# Patient Record
Sex: Female | Born: 1975 | Race: Black or African American | Hispanic: No | Smoking: Never smoker
Health system: Southern US, Community
[De-identification: ages and names within clinical notes are randomized; demographics above are authoritative.]

## PROBLEM LIST (undated history)

## (undated) DIAGNOSIS — E7800 Pure hypercholesterolemia, unspecified: Secondary | ICD-10-CM

## (undated) DIAGNOSIS — M797 Fibromyalgia: Secondary | ICD-10-CM

## (undated) DIAGNOSIS — I10 Essential (primary) hypertension: Secondary | ICD-10-CM

## (undated) DIAGNOSIS — K219 Gastro-esophageal reflux disease without esophagitis: Secondary | ICD-10-CM

## (undated) DIAGNOSIS — R42 Dizziness and giddiness: Secondary | ICD-10-CM

## (undated) DIAGNOSIS — G4730 Sleep apnea, unspecified: Secondary | ICD-10-CM

## (undated) HISTORY — PX: ABDOMINAL SURGERY: SHX537

## (undated) HISTORY — PX: HERNIA REPAIR: SHX51

---

## 2010-11-01 ENCOUNTER — Emergency Department: Payer: Self-pay | Admitting: Emergency Medicine

## 2011-02-09 ENCOUNTER — Emergency Department: Payer: Self-pay | Admitting: *Deleted

## 2011-03-30 ENCOUNTER — Emergency Department: Payer: Self-pay | Admitting: *Deleted

## 2012-02-29 ENCOUNTER — Emergency Department: Payer: Self-pay | Admitting: Emergency Medicine

## 2012-07-28 ENCOUNTER — Emergency Department: Payer: Self-pay | Admitting: Emergency Medicine

## 2012-07-29 LAB — URINALYSIS, COMPLETE
Bacteria: NONE SEEN
Bilirubin,UR: NEGATIVE
Blood: NEGATIVE
Ketone: NEGATIVE
Ph: 5 (ref 4.5–8.0)
Protein: NEGATIVE
RBC,UR: 1 /HPF (ref 0–5)
Specific Gravity: 1.025 (ref 1.003–1.030)
Squamous Epithelial: 1
WBC UR: 1 /HPF (ref 0–5)

## 2017-02-12 DIAGNOSIS — G4733 Obstructive sleep apnea (adult) (pediatric): Secondary | ICD-10-CM | POA: Insufficient documentation

## 2019-05-02 ENCOUNTER — Other Ambulatory Visit: Payer: Self-pay

## 2019-05-02 ENCOUNTER — Emergency Department: Payer: Medicaid - Out of State

## 2019-05-02 ENCOUNTER — Emergency Department
Admission: EM | Admit: 2019-05-02 | Discharge: 2019-05-03 | Disposition: A | Discharge status: Home / Self Care | Payer: Medicaid - Out of State | Attending: Emergency Medicine | Admitting: Emergency Medicine

## 2019-05-02 ENCOUNTER — Encounter: Payer: Self-pay | Admitting: *Deleted

## 2019-05-02 DIAGNOSIS — R05 Cough: Secondary | ICD-10-CM | POA: Diagnosis not present

## 2019-05-02 DIAGNOSIS — R519 Headache, unspecified: Secondary | ICD-10-CM

## 2019-05-02 DIAGNOSIS — I10 Essential (primary) hypertension: Secondary | ICD-10-CM | POA: Insufficient documentation

## 2019-05-02 DIAGNOSIS — Z20828 Contact with and (suspected) exposure to other viral communicable diseases: Secondary | ICD-10-CM | POA: Diagnosis not present

## 2019-05-02 DIAGNOSIS — Z3202 Encounter for pregnancy test, result negative: Secondary | ICD-10-CM | POA: Insufficient documentation

## 2019-05-02 DIAGNOSIS — R0602 Shortness of breath: Secondary | ICD-10-CM

## 2019-05-02 HISTORY — DX: Pure hypercholesterolemia, unspecified: E78.00

## 2019-05-02 HISTORY — DX: Gastro-esophageal reflux disease without esophagitis: K21.9

## 2019-05-02 HISTORY — DX: Dizziness and giddiness: R42

## 2019-05-02 HISTORY — DX: Essential (primary) hypertension: I10

## 2019-05-02 HISTORY — DX: Fibromyalgia: M79.7

## 2019-05-02 LAB — URINALYSIS, COMPLETE (UACMP) WITH MICROSCOPIC
Bacteria, UA: NONE SEEN
Bilirubin Urine: NEGATIVE
Glucose, UA: NEGATIVE mg/dL
Hgb urine dipstick: NEGATIVE
Ketones, ur: NEGATIVE mg/dL
Leukocytes,Ua: NEGATIVE
Nitrite: NEGATIVE
Protein, ur: NEGATIVE mg/dL
Specific Gravity, Urine: 1.016 (ref 1.005–1.030)
pH: 5 (ref 5.0–8.0)

## 2019-05-02 LAB — BASIC METABOLIC PANEL
Anion gap: 10 (ref 5–15)
BUN: 9 mg/dL (ref 6–20)
CO2: 25 mmol/L (ref 22–32)
Calcium: 9.2 mg/dL (ref 8.9–10.3)
Chloride: 102 mmol/L (ref 98–111)
Creatinine, Ser: 0.67 mg/dL (ref 0.44–1.00)
GFR calc Af Amer: >60 mL/min (ref >60)
GFR calc non Af Amer: >60 mL/min (ref >60)
Glucose, Bld: 140 mg/dL — ABNORMAL HIGH (ref 70–99)
Potassium: 3.4 mmol/L — ABNORMAL LOW (ref 3.5–5.1)
Sodium: 137 mmol/L (ref 135–145)

## 2019-05-02 LAB — CBC
HCT: 36.7 % (ref 36.0–46.0)
Hemoglobin: 11.8 g/dL — ABNORMAL LOW (ref 12.0–15.0)
MCH: 27.6 pg (ref 26.0–34.0)
MCHC: 32.2 g/dL (ref 30.0–36.0)
MCV: 85.9 fL (ref 80.0–100.0)
Platelets: 347 10*3/uL (ref 150–400)
RBC: 4.27 MIL/uL (ref 3.87–5.11)
RDW: 14.9 % (ref 11.5–15.5)
WBC: 5.8 10*3/uL (ref 4.0–10.5)
nRBC: 0 % (ref 0.0–0.2)

## 2019-05-02 LAB — POCT PREGNANCY, URINE: Preg Test, Ur: NEGATIVE

## 2019-05-02 LAB — TROPONIN I (HIGH SENSITIVITY): Troponin I (High Sensitivity): 8 ng/L (ref <18)

## 2019-05-02 MED ORDER — SODIUM CHLORIDE 0.9% FLUSH: 3.0000 mL | Freq: Once | INTRAVENOUS | Status: DC

## 2019-05-02 NOTE — ED Triage Notes (Signed)
Pt c/o hypertension and increased shortness of breath. Pt w/ increased dry cough and sinus drainage. Pt's HTN has been poorly controlled x past 3 days. Pt denies exposure to known covid + person. Pt is dizzy in triage and had to lean against a wall to stand until a WC was provided. Pt has moved to this area to take care of her family who is ill and has no PCP in [place]. Pt states she is taking her usual home meds as rx'd and is not out of any of them.

## 2019-05-03 ENCOUNTER — Emergency Department: Payer: Medicaid - Out of State

## 2019-05-03 ENCOUNTER — Encounter: Payer: Self-pay | Admitting: Radiology

## 2019-05-03 LAB — POC SARS CORONAVIRUS 2 AG: SARS Coronavirus 2 Ag: NEGATIVE

## 2019-05-03 LAB — TROPONIN I (HIGH SENSITIVITY): Troponin I (High Sensitivity): 7 ng/L (ref <18)

## 2019-05-03 IMAGING — CT CT ANGIO HEAD
1 of 10 series · 6 of 33 positions shown · IV contrast (omnipaque)
Comparison: None.

CLINICAL DATA: Dizziness

EXAM:
CT ANGIOGRAPHY HEAD AND NECK
TECHNIQUE: Multidetector CT imaging of the head and neck was performed using
the standard protocol during bolus administration of intravenous
contrast. Multiplanar CT image reconstructions and MIPs were
obtained to evaluate the vascular anatomy. Carotid stenosis
measurements (when applicable) are obtained utilizing NASCET
criteria, using the distal internal carotid diameter as the
denominator.
CONTRAST:  75mL OMNIPAQUE IOHEXOL 350 MG/ML SOLN

[Series 11: ax thin · axial · 0.48mm/px · z∈[-233,-31]mm · 6 of 285 slices shown]
[im 41/285  soft-tissue]
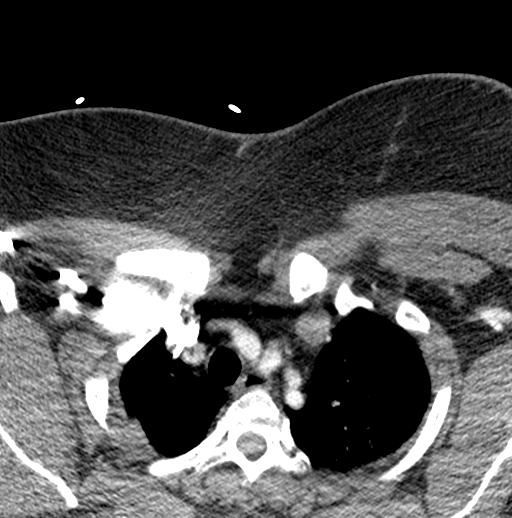
[im 82/285  bone]
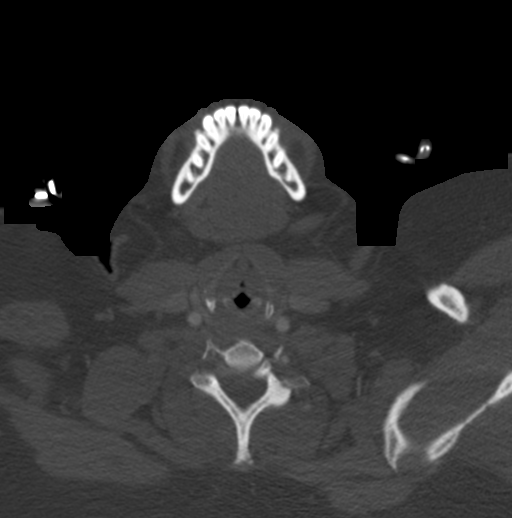
[im 122/285  soft-tissue]
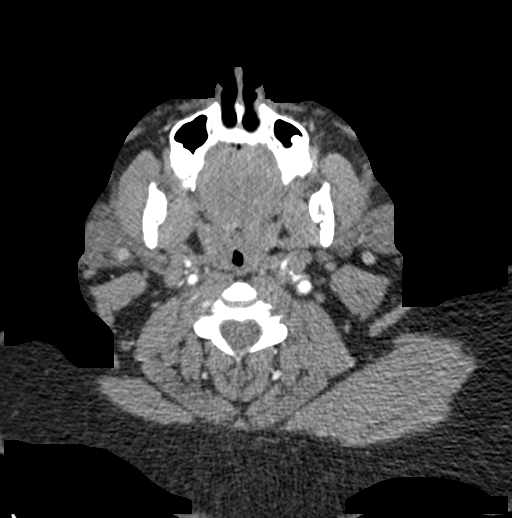
[im 163/285  bone]
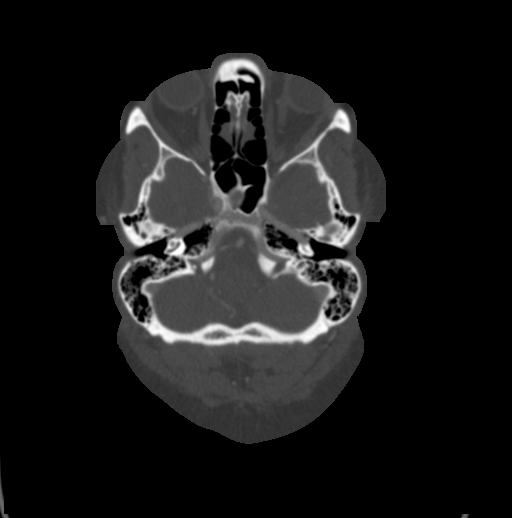
[im 203/285  soft-tissue]
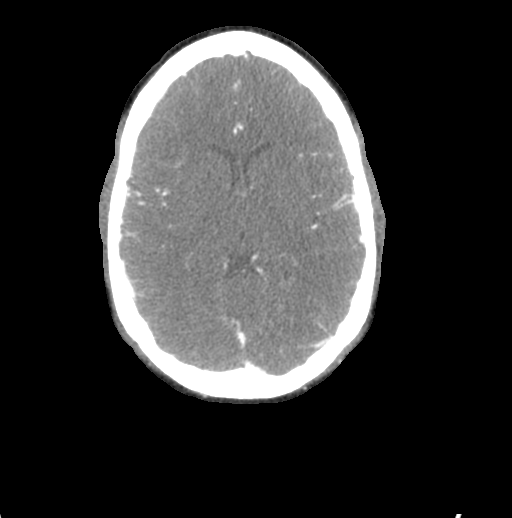
[im 244/285  bone]
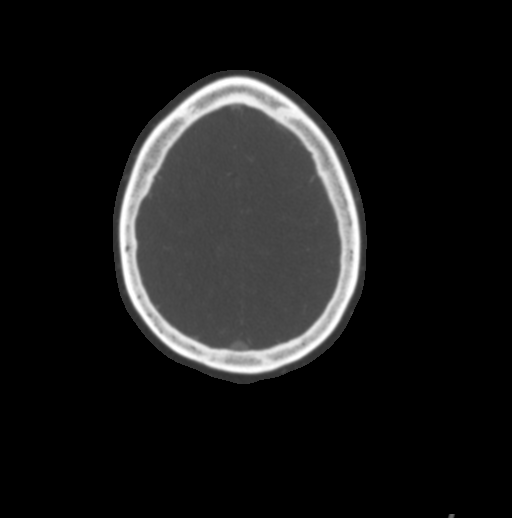

[6 of 33 positions shown; findings below may reference images not displayed]

FINDINGS: CT HEAD FINDINGS

Brain: There is no mass, hemorrhage or extra-axial collection. The
size and configuration of the ventricles and extra-axial CSF spaces
are normal. There is no acute or chronic infarction. The brain
parenchyma is normal.

Skull: The visualized skull base, calvarium and extracranial soft
tissues are normal.

Sinuses/Orbits: No fluid levels or advanced mucosal thickening of
the visualized paranasal sinuses. No mastoid or middle ear effusion.
The orbits are normal.

CTA NECK FINDINGS

SKELETON: There is no bony spinal canal stenosis. No lytic or
blastic lesion.

OTHER NECK: Normal pharynx, larynx and major salivary glands. No
cervical lymphadenopathy. Unremarkable thyroid gland.

UPPER CHEST: No pneumothorax or pleural effusion. No nodules or
masses.

AORTIC ARCH:

There is no calcific atherosclerosis of the aortic arch. There is no
aneurysm, dissection or hemodynamically significant stenosis of the
visualized portion of the aorta. Conventional 3 vessel aortic
branching pattern. The visualized proximal subclavian arteries are
widely patent.

RIGHT CAROTID SYSTEM: Normal without aneurysm, dissection or
stenosis.

LEFT CAROTID SYSTEM: Normal without aneurysm, dissection or
stenosis.

VERTEBRAL ARTERIES: Left dominant configuration. Both origins are
clearly patent. There is no dissection, occlusion or flow-limiting
stenosis to the skull base (V1-V3 segments).

CTA HEAD FINDINGS

POSTERIOR CIRCULATION:

--Vertebral arteries: Normal V4 segments.

--Posterior inferior cerebellar arteries (PICA): Patent origins from
the vertebral arteries.

--Anterior inferior cerebellar arteries (AICA): Patent origins from
the basilar artery.

--Basilar artery: Normal.

--Superior cerebellar arteries: Normal.

--Posterior cerebral arteries: Normal. Both originate from the
basilar artery. Posterior communicating arteries (p-comm) are
diminutive or absent.

ANTERIOR CIRCULATION:

--Intracranial internal carotid arteries: Normal.

--Anterior cerebral arteries (ACA): Normal. Both A1 segments are
present. Patent anterior communicating artery (a-comm).

--Middle cerebral arteries (MCA): Normal.

VENOUS SINUSES: As permitted by contrast timing, patent.

ANATOMIC VARIANTS: None

Review of the MIP images confirms the above findings.
IMPRESSION: Normal CTA of the head and neck.

## 2019-05-03 MED ORDER — IOHEXOL 350 MG/ML SOLN
75.0000 mL | Freq: Once | INTRAVENOUS | Status: AC | PRN
  Administered 2019-05-03: 01:00:00 75 mL via INTRAVENOUS

## 2019-05-03 NOTE — ED Provider Notes (Signed)
[facility] Emergency Department Provider Note  ____________________________________________   First MD Initiated Contact with Patient 05/02/19 2357     (approximate)  I have reviewed the triage vital signs and the nursing notes.   HISTORY  Chief Complaint Shortness of Breath   HPI Shelby Simmons is a 43 y.o. female with below list of previous medical conditions including vertigo presents emergency department secondary to increasing dyspnea headache nonproductive cough sinus drainage over the past 3 days.  Patient states that she has been taking her antihypertensives as planned however does note increased blood pressure as well.  Patient states that she recently had an increase in her dosage.  Patient states that symptoms began after [place] care of her mother.  Patient states that the home that she is in is heated by gas stove and she is concerned that this may be possibly the reason for her symptoms.  Patient denies any fever no facial pain.        Past Medical History:  Diagnosis Date  . Fibromyalgia   . GERD (gastroesophageal reflux disease)   . Hypercholesterolemia   . Hypertension   . Vertigo     There are no problems to display for this patient.   Past Surgical History:  Procedure Laterality Date  . ABDOMINAL SURGERY    . HERNIA REPAIR      Prior to Admission medications   Not on File    Allergies Patient has no known allergies.  History reviewed. No pertinent family history.  Social History Social History   Tobacco Use  . Smoking status: Never Smoker  . Smokeless tobacco: Never Used  Substance Use Topics  . Alcohol use: Yes  . Drug use: Never    Review of Systems Constitutional: No fever/chills Eyes: No visual changes. ENT: No sore throat.  Positive for nasal congestion Cardiovascular: Denies chest pain. Respiratory: Positive for shortness of breath. Gastrointestinal: No abdominal  pain.  No nausea, no vomiting.  No diarrhea.  No constipation. Genitourinary: Negative for dysuria. Musculoskeletal: Negative for neck pain.  Negative for back pain. Integumentary: Negative for rash. Neurological: Negative for headaches, focal weakness or numbness.  Positive for dizziness   ____________________________________________   PHYSICAL EXAM:  VITAL SIGNS: ED Triage Vitals  Enc Vitals Group     BP 05/02/19 2016 (!) 162/104     Pulse Rate 05/02/19 2016 92     Resp 05/02/19 2016 (!) 28     Temp 05/02/19 2016 98.6 F (37 C)     Temp Source 05/02/19 2016 Oral     SpO2 05/02/19 2016 97 %     Weight 05/02/19 2018 99.3 kg (219 lb)     Height 05/02/19 2018 1.473 m (4\' 10" )     Head Circumference --      Peak Flow --      Pain Score 05/02/19 2017 7     Pain Loc --      Pain Edu? --      Excl. in GC? --     Constitutional: Alert and oriented.  Eyes: Conjunctivae are normal.  Head: Atraumatic. Ears:  Healthy appearing ear canals and TMs bilaterally Nose: No congestion/rhinnorhea. Mouth/Throat: Patient is wearing a mask. Neck: No stridor.  No meningeal signs.   Cardiovascular: Normal rate, regular rhythm. Good peripheral circulation. Grossly normal heart sounds. Respiratory: Normal respiratory effort.  No retractions. Gastrointestinal: Soft and nontender. No distention.  Musculoskeletal: No lower extremity tenderness nor  edema. No gross deformities of extremities. Neurologic:  Normal speech and language. No gross focal neurologic deficits are appreciated.  Skin:  Skin is warm, dry and intact. Psychiatric: Mood and affect are normal. Speech and behavior are normal.  ____________________________________________   LABS (all labs ordered are listed, but only abnormal results are displayed)  Labs Reviewed  CBC - Abnormal; Notable for the following components:      Result Value   Hemoglobin 11.8 (*)    All other components within normal limits  URINALYSIS, COMPLETE  (UACMP) WITH MICROSCOPIC - Abnormal; Notable for the following components:   Color, Urine YELLOW (*)    APPearance HAZY (*)    All other components within normal limits  BASIC METABOLIC PANEL - Abnormal; Notable for the following components:   Potassium 3.4 (*)    Glucose, Bld 140 (*)    All other components within normal limits  COOXEMETRY PANEL  POC URINE PREG, ED  POCT PREGNANCY, URINE  POC SARS CORONAVIRUS 2 AG -  ED  POC SARS CORONAVIRUS 2 AG  TROPONIN I (HIGH SENSITIVITY)  TROPONIN I (HIGH SENSITIVITY)   ____________________________________________  EKG  ED ECG REPORT I, Runnels N Chelsy Parrales, the attending physician, personally viewed and interpreted this ECG.   Date: 05/03/2019  EKG Time: 8:32 PM  Rate: 99  Rhythm: Normal sinus rhythm  Axis: Normal  Intervals: Normal  ST&T Change: None  ____________________________________________  RADIOLOGY I,  N Jerime Arif, personally viewed and evaluated these images (plain radiographs) as part of my medical decision making, as well as reviewing the written report by the radiologist.  ED MD interpretation: Normal CTA of the head and neck.  Official radiology report(s): CT Angio Head W or Wo Contrast  Result Date: 05/03/2019 CLINICAL DATA:  Dizziness EXAM: CT ANGIOGRAPHY HEAD AND NECK TECHNIQUE: Multidetector CT imaging of the head and neck was performed using the standard protocol during bolus administration of intravenous contrast. Multiplanar CT image reconstructions and MIPs were obtained to evaluate the vascular anatomy. Carotid stenosis measurements (when applicable) are obtained utilizing NASCET criteria, using the distal internal carotid diameter as the denominator. CONTRAST:  75mL OMNIPAQUE IOHEXOL 350 MG/ML SOLN COMPARISON:  None. FINDINGS: CT HEAD FINDINGS Brain: There is no mass, hemorrhage or extra-axial collection. The size and configuration of the ventricles and extra-axial CSF spaces are normal. There is no acute or  chronic infarction. The brain parenchyma is normal. Skull: The visualized skull base, calvarium and extracranial soft tissues are normal. Sinuses/Orbits: No fluid levels or advanced mucosal thickening of the visualized paranasal sinuses. No mastoid or middle ear effusion. The orbits are normal. CTA NECK FINDINGS SKELETON: There is no bony spinal canal stenosis. No lytic or blastic lesion. OTHER NECK: Normal pharynx, larynx and major salivary glands. No cervical lymphadenopathy. Unremarkable thyroid gland. UPPER CHEST: No pneumothorax or pleural effusion. No nodules or masses. AORTIC ARCH: There is no calcific atherosclerosis of the aortic arch. There is no aneurysm, dissection or hemodynamically significant stenosis of the visualized portion of the aorta. Conventional 3 vessel aortic branching pattern. The visualized proximal subclavian arteries are widely patent. RIGHT CAROTID SYSTEM: Normal without aneurysm, dissection or stenosis. LEFT CAROTID SYSTEM: Normal without aneurysm, dissection or stenosis. VERTEBRAL ARTERIES: Left dominant configuration. Both origins are clearly patent. There is no dissection, occlusion or flow-limiting stenosis to the skull base (V1-V3 segments). CTA HEAD FINDINGS POSTERIOR CIRCULATION: --Vertebral arteries: Normal V4 segments. --Posterior inferior cerebellar arteries (PICA): Patent origins from the vertebral arteries. --Anterior inferior cerebellar arteries (AICA): Patent  origins from the basilar artery. --Basilar artery: Normal. --Superior cerebellar arteries: Normal. --Posterior cerebral arteries: Normal. Both originate from the basilar artery. Posterior communicating arteries (p-comm) are diminutive or absent. ANTERIOR CIRCULATION: --Intracranial internal carotid arteries: Normal. --Anterior cerebral arteries (ACA): Normal. Both A1 segments are present. Patent anterior communicating artery (a-comm). --Middle cerebral arteries (MCA): Normal. VENOUS SINUSES: As permitted by contrast  timing, patent. ANATOMIC VARIANTS: None Review of the MIP images confirms the above findings. IMPRESSION: Normal CTA of the head and neck. Electronically Signed   By: Ulyses Jarred M.D.   On: 05/03/2019 01:03   CT Angio Neck W and/or Wo Contrast  Result Date: 05/03/2019 CLINICAL DATA:  Dizziness EXAM: CT ANGIOGRAPHY HEAD AND NECK TECHNIQUE: Multidetector CT imaging of the head and neck was performed using the standard protocol during bolus administration of intravenous contrast. Multiplanar CT image reconstructions and MIPs were obtained to evaluate the vascular anatomy. Carotid stenosis measurements (when applicable) are obtained utilizing NASCET criteria, using the distal internal carotid diameter as the denominator. CONTRAST:  44mL OMNIPAQUE IOHEXOL 350 MG/ML SOLN COMPARISON:  None. FINDINGS: CT HEAD FINDINGS Brain: There is no mass, hemorrhage or extra-axial collection. The size and configuration of the ventricles and extra-axial CSF spaces are normal. There is no acute or chronic infarction. The brain parenchyma is normal. Skull: The visualized skull base, calvarium and extracranial soft tissues are normal. Sinuses/Orbits: No fluid levels or advanced mucosal thickening of the visualized paranasal sinuses. No mastoid or middle ear effusion. The orbits are normal. CTA NECK FINDINGS SKELETON: There is no bony spinal canal stenosis. No lytic or blastic lesion. OTHER NECK: Normal pharynx, larynx and major salivary glands. No cervical lymphadenopathy. Unremarkable thyroid gland. UPPER CHEST: No pneumothorax or pleural effusion. No nodules or masses. AORTIC ARCH: There is no calcific atherosclerosis of the aortic arch. There is no aneurysm, dissection or hemodynamically significant stenosis of the visualized portion of the aorta. Conventional 3 vessel aortic branching pattern. The visualized proximal subclavian arteries are widely patent. RIGHT CAROTID SYSTEM: Normal without aneurysm, dissection or stenosis. LEFT  CAROTID SYSTEM: Normal without aneurysm, dissection or stenosis. VERTEBRAL ARTERIES: Left dominant configuration. Both origins are clearly patent. There is no dissection, occlusion or flow-limiting stenosis to the skull base (V1-V3 segments). CTA HEAD FINDINGS POSTERIOR CIRCULATION: --Vertebral arteries: Normal V4 segments. --Posterior inferior cerebellar arteries (PICA): Patent origins from the vertebral arteries. --Anterior inferior cerebellar arteries (AICA): Patent origins from the basilar artery. --Basilar artery: Normal. --Superior cerebellar arteries: Normal. --Posterior cerebral arteries: Normal. Both originate from the basilar artery. Posterior communicating arteries (p-comm) are diminutive or absent. ANTERIOR CIRCULATION: --Intracranial internal carotid arteries: Normal. --Anterior cerebral arteries (ACA): Normal. Both A1 segments are present. Patent anterior communicating artery (a-comm). --Middle cerebral arteries (MCA): Normal. VENOUS SINUSES: As permitted by contrast timing, patent. ANATOMIC VARIANTS: None Review of the MIP images confirms the above findings. IMPRESSION: Normal CTA of the head and neck. Electronically Signed   By: Ulyses Jarred M.D.   On: 05/03/2019 01:03   DG Chest Port 1 View  Result Date: 05/02/2019 CLINICAL DATA:  Increasing shortness of breath with hypertension. EXAM: PORTABLE CHEST 1 VIEW COMPARISON:  November 01, 2010 FINDINGS: The heart size is borderline enlarged but similar to prior study. There is no pneumothorax. No large pleural effusion. No focal infiltrate. No acute osseous abnormality. IMPRESSION: No active disease. Electronically Signed   By: Constance Holster M.D.   On: 05/02/2019 23:11     Procedures   ____________________________________________   INITIAL IMPRESSION /  MDM / ASSESSMENT AND PLAN / ED COURSE  As part of my medical decision making, I reviewed the following data within the electronic MEDICAL RECORD NUMBER   43 year old female presented with  above-stated history and physical exam secondary to above-stated symptoms.  Concern for possible intracranial pathology including posterior circulation pathology given posterior neck pain headache and hypertension CT angiogram of the head and neck performed which revealed no acute pathology.  Also considered possibly of sinusitis however sinuses were normal on CT.  Considered possibility of carboxyhemoglobin and as such this was performed which was also normal. ____________________________________________  FINAL CLINICAL IMPRESSION(S) / ED DIAGNOSES  Final diagnoses:  Nonintractable headache, unspecified chronicity pattern, unspecified headache type  Hypertension, unspecified type     MEDICATIONS GIVEN DURING THIS VISIT:  Medications  iohexol (OMNIPAQUE) 350 MG/ML injection 75 mL (75 mLs Intravenous Contrast Given 05/03/19 0047)     ED Discharge Orders    None      *Please note:  Shelby Simmons was evaluated in Emergency Department on 05/03/2019 for the symptoms described in the history of present illness. She was evaluated in the context of the global COVID-19 pandemic, which necessitated consideration that the patient might be at risk for infection with the SARS-CoV-2 virus that causes COVID-19. Institutional protocols and algorithms that pertain to the evaluation of patients at risk for COVID-19 are in a state of rapid change based on information released by regulatory bodies including the CDC and federal and state organizations. These policies and algorithms were followed during the patient's care in the ED.  Some ED evaluations and interventions may be delayed as a result of limited staffing during the pandemic.*  Note:  This document was prepared using Dragon voice recognition software and may include unintentional dictation errors.   Darci Current, MD 05/03/19 [phone]

## 2019-05-14 LAB — COOXEMETRY PANEL
Carboxyhemoglobin: 0 % — ABNORMAL LOW (ref 0.5–1.5)
Methemoglobin: 0 % (ref 0.0–1.5)
O2 Saturation: 87.7 %

## 2020-02-09 DIAGNOSIS — M797 Fibromyalgia: Secondary | ICD-10-CM | POA: Insufficient documentation

## 2020-02-09 DIAGNOSIS — G8929 Other chronic pain: Secondary | ICD-10-CM | POA: Insufficient documentation

## 2020-02-09 DIAGNOSIS — I10 Essential (primary) hypertension: Secondary | ICD-10-CM | POA: Insufficient documentation

## 2020-02-21 DIAGNOSIS — D649 Anemia, unspecified: Secondary | ICD-10-CM | POA: Insufficient documentation

## 2020-03-24 DIAGNOSIS — Z8719 Personal history of other diseases of the digestive system: Secondary | ICD-10-CM | POA: Insufficient documentation

## 2020-04-05 DIAGNOSIS — G5601 Carpal tunnel syndrome, right upper limb: Secondary | ICD-10-CM | POA: Insufficient documentation

## 2020-09-29 ENCOUNTER — Other Ambulatory Visit: Payer: Self-pay

## 2020-09-29 ENCOUNTER — Encounter: Payer: Self-pay | Admitting: Emergency Medicine

## 2020-09-29 ENCOUNTER — Emergency Department
Admission: EM | Admit: 2020-09-29 | Discharge: 2020-09-29 | Disposition: A | Discharge status: Home / Self Care | Payer: Self-pay | Attending: Emergency Medicine | Admitting: Emergency Medicine

## 2020-09-29 ENCOUNTER — Emergency Department: Payer: Self-pay

## 2020-09-29 DIAGNOSIS — Z79899 Other long term (current) drug therapy: Secondary | ICD-10-CM | POA: Insufficient documentation

## 2020-09-29 DIAGNOSIS — S20211A Contusion of right front wall of thorax, initial encounter: Secondary | ICD-10-CM | POA: Insufficient documentation

## 2020-09-29 DIAGNOSIS — I10 Essential (primary) hypertension: Secondary | ICD-10-CM | POA: Insufficient documentation

## 2020-09-29 DIAGNOSIS — Y92410 Unspecified street and highway as the place of occurrence of the external cause: Secondary | ICD-10-CM | POA: Insufficient documentation

## 2020-09-29 DIAGNOSIS — S9031XA Contusion of right foot, initial encounter: Secondary | ICD-10-CM | POA: Insufficient documentation

## 2020-09-29 MED ORDER — LIDOCAINE 5 % EX PTCH
2.0000 | MEDICATED_PATCH | CUTANEOUS | Status: DC
  Administered 2020-09-29: 2 via TRANSDERMAL
  Filled 2020-09-29: qty 2

## 2020-09-29 MED ORDER — NAPROXEN 500 MG PO TABS
500.0000 mg | ORAL_TABLET | Freq: Once | ORAL | Status: AC
  Administered 2020-09-29: 500 mg via ORAL
  Filled 2020-09-29: qty 1

## 2020-09-29 MED ORDER — ORPHENADRINE CITRATE ER 100 MG PO TB12: 100.0000 mg | ORAL_TABLET | Freq: Two times a day (BID) | ORAL | 0 refills | Status: DC

## 2020-09-29 MED ORDER — CYCLOBENZAPRINE HCL 10 MG PO TABS
10.0000 mg | ORAL_TABLET | Freq: Once | ORAL | Status: AC
  Administered 2020-09-29: 10 mg via ORAL
  Filled 2020-09-29: qty 1

## 2020-09-29 MED ORDER — NAPROXEN 500 MG PO TABS: 500.0000 mg | ORAL_TABLET | Freq: Two times a day (BID) | ORAL | 0 refills | Status: DC

## 2020-09-29 MED ORDER — TRAMADOL HCL 50 MG PO TABS
50.0000 mg | ORAL_TABLET | Freq: Once | ORAL | Status: AC
  Administered 2020-09-29: 50 mg via ORAL
  Filled 2020-09-29: qty 1
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
## 2020-09-29 NOTE — Discharge Instructions (Signed)
No acute findings on chest and foot x-ray.  Follow discharge care instruction take medication as directed.  Wear the Lidoderm patch for 12 hours.

## 2020-12-08 DIAGNOSIS — Z139 Encounter for screening, unspecified: Secondary | ICD-10-CM

## 2020-12-08 LAB — GLUCOSE, POCT (MANUAL RESULT ENTRY): POC Glucose: 89 mg/dl (ref 70–99)
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
## 2021-01-20 ENCOUNTER — Other Ambulatory Visit (HOSPITAL_COMMUNITY): Payer: Self-pay | Admitting: Family Medicine

## 2021-01-20 ENCOUNTER — Other Ambulatory Visit: Payer: Self-pay | Admitting: Family Medicine

## 2021-01-20 DIAGNOSIS — R1011 Right upper quadrant pain: Secondary | ICD-10-CM

## 2021-01-20 DIAGNOSIS — R110 Nausea: Secondary | ICD-10-CM

## 2021-01-25 ENCOUNTER — Other Ambulatory Visit: Payer: Self-pay | Admitting: Family Medicine

## 2021-01-25 DIAGNOSIS — R112 Nausea with vomiting, unspecified: Secondary | ICD-10-CM

## 2021-01-25 DIAGNOSIS — R109 Unspecified abdominal pain: Secondary | ICD-10-CM

## 2021-01-25 DIAGNOSIS — R1011 Right upper quadrant pain: Secondary | ICD-10-CM

## 2021-02-11 ENCOUNTER — Other Ambulatory Visit: Payer: Self-pay

## 2021-02-11 ENCOUNTER — Ambulatory Visit
Admission: RE | Admit: 2021-02-11 | Discharge: 2021-02-11 | Disposition: A | Discharge status: Home / Self Care | Payer: Self-pay | Source: Ambulatory Visit | Attending: Family Medicine | Admitting: Family Medicine

## 2021-02-11 DIAGNOSIS — R109 Unspecified abdominal pain: Secondary | ICD-10-CM | POA: Insufficient documentation

## 2021-02-11 DIAGNOSIS — R112 Nausea with vomiting, unspecified: Secondary | ICD-10-CM | POA: Insufficient documentation

## 2021-02-11 DIAGNOSIS — R1011 Right upper quadrant pain: Secondary | ICD-10-CM | POA: Insufficient documentation

## 2021-02-11 LAB — POCT I-STAT CREATININE: Creatinine, Ser: 0.7 mg/dL (ref 0.44–1.00)

## 2021-02-11 MED ORDER — IOHEXOL 350 MG/ML SOLN
100.0000 mL | Freq: Once | INTRAVENOUS | Status: AC | PRN
  Administered 2021-02-11: 100 mL via INTRAVENOUS

## 2021-02-14 ENCOUNTER — Ambulatory Visit: Admission: RE | Admit: 2021-02-14 | Payer: Self-pay | Source: Ambulatory Visit

## 2021-11-14 DIAGNOSIS — R531 Weakness: Secondary | ICD-10-CM | POA: Insufficient documentation

## 2021-11-14 DIAGNOSIS — K219 Gastro-esophageal reflux disease without esophagitis: Secondary | ICD-10-CM | POA: Insufficient documentation

## 2021-11-15 DIAGNOSIS — F444 Conversion disorder with motor symptom or deficit: Secondary | ICD-10-CM | POA: Insufficient documentation

## 2022-04-05 ENCOUNTER — Encounter: Payer: Self-pay | Admitting: Emergency Medicine

## 2022-04-05 ENCOUNTER — Ambulatory Visit
Admission: EM | Admit: 2022-04-05 | Discharge: 2022-04-05 | Disposition: A | Discharge status: Home / Self Care | Payer: Self-pay | Attending: Family Medicine | Admitting: Family Medicine

## 2022-04-05 DIAGNOSIS — Z1152 Encounter for screening for COVID-19: Secondary | ICD-10-CM | POA: Insufficient documentation

## 2022-04-05 DIAGNOSIS — R058 Other specified cough: Secondary | ICD-10-CM | POA: Insufficient documentation

## 2022-04-05 DIAGNOSIS — M546 Pain in thoracic spine: Secondary | ICD-10-CM | POA: Insufficient documentation

## 2022-04-05 DIAGNOSIS — E871 Hypo-osmolality and hyponatremia: Secondary | ICD-10-CM | POA: Insufficient documentation

## 2022-04-05 DIAGNOSIS — J40 Bronchitis, not specified as acute or chronic: Secondary | ICD-10-CM | POA: Insufficient documentation

## 2022-04-05 DIAGNOSIS — Z79899 Other long term (current) drug therapy: Secondary | ICD-10-CM | POA: Insufficient documentation

## 2022-04-05 DIAGNOSIS — R197 Diarrhea, unspecified: Secondary | ICD-10-CM | POA: Insufficient documentation

## 2022-04-05 DIAGNOSIS — R1010 Upper abdominal pain, unspecified: Secondary | ICD-10-CM | POA: Insufficient documentation

## 2022-04-05 DIAGNOSIS — J329 Chronic sinusitis, unspecified: Secondary | ICD-10-CM | POA: Insufficient documentation

## 2022-04-05 DIAGNOSIS — E876 Hypokalemia: Secondary | ICD-10-CM | POA: Insufficient documentation

## 2022-04-05 DIAGNOSIS — H6692 Otitis media, unspecified, left ear: Secondary | ICD-10-CM | POA: Insufficient documentation

## 2022-04-05 LAB — URINALYSIS, ROUTINE W REFLEX MICROSCOPIC
Bilirubin Urine: NEGATIVE
Glucose, UA: NEGATIVE mg/dL
Hgb urine dipstick: NEGATIVE
Ketones, ur: NEGATIVE mg/dL
Leukocytes,Ua: NEGATIVE
Nitrite: NEGATIVE
Protein, ur: NEGATIVE mg/dL
Specific Gravity, Urine: <1.005 — ABNORMAL LOW (ref 1.005–1.030)
pH: 6 (ref 5.0–8.0)

## 2022-04-05 LAB — CBC WITH DIFFERENTIAL/PLATELET
Abs Immature Granulocytes: 0.02 10*3/uL (ref 0.00–0.07)
Basophils Absolute: 0 10*3/uL (ref 0.0–0.1)
Basophils Relative: 0 %
Eosinophils Absolute: 0.1 10*3/uL (ref 0.0–0.5)
Eosinophils Relative: 1 %
HCT: 37.8 % (ref 36.0–46.0)
Hemoglobin: 12.8 g/dL (ref 12.0–15.0)
Immature Granulocytes: 0 %
Lymphocytes Relative: 38 %
Lymphs Abs: 2.7 10*3/uL (ref 0.7–4.0)
MCH: 31.4 pg (ref 26.0–34.0)
MCHC: 33.9 g/dL (ref 30.0–36.0)
MCV: 92.9 fL (ref 80.0–100.0)
Monocytes Absolute: 0.7 10*3/uL (ref 0.1–1.0)
Monocytes Relative: 10 %
Neutro Abs: 3.7 10*3/uL (ref 1.7–7.7)
Neutrophils Relative %: 51 %
Platelets: 272 10*3/uL (ref 150–400)
RBC: 4.07 MIL/uL (ref 3.87–5.11)
RDW: 14.3 % (ref 11.5–15.5)
WBC: 7.2 10*3/uL (ref 4.0–10.5)
nRBC: 0 % (ref 0.0–0.2)

## 2022-04-05 LAB — COMPREHENSIVE METABOLIC PANEL
ALT: 23 U/L (ref 0–44)
AST: 25 U/L (ref 15–41)
Albumin: 3.8 g/dL (ref 3.5–5.0)
Alkaline Phosphatase: 55 U/L (ref 38–126)
Anion gap: 6 (ref 5–15)
BUN: 12 mg/dL (ref 6–20)
CO2: 23 mmol/L (ref 22–32)
Calcium: 9.4 mg/dL (ref 8.9–10.3)
Chloride: 105 mmol/L (ref 98–111)
Creatinine, Ser: 0.95 mg/dL (ref 0.44–1.00)
GFR, Estimated: >60 mL/min (ref >60)
Glucose, Bld: 94 mg/dL (ref 70–99)
Potassium: 3.4 mmol/L — ABNORMAL LOW (ref 3.5–5.1)
Sodium: 134 mmol/L — ABNORMAL LOW (ref 135–145)
Total Bilirubin: 0.4 mg/dL (ref 0.3–1.2)
Total Protein: 8 g/dL (ref 6.5–8.1)

## 2022-04-05 LAB — RESP PANEL BY RT-PCR (FLU A&B, COVID) ARPGX2
Influenza A by PCR: NEGATIVE
Influenza B by PCR: NEGATIVE
SARS Coronavirus 2 by RT PCR: NEGATIVE

## 2022-04-05 LAB — LIPASE, BLOOD: Lipase: 42 U/L (ref 11–51)

## 2022-04-05 MED ORDER — AZITHROMYCIN 250 MG PO TABS: 250.0000 mg | ORAL_TABLET | Freq: Every day | ORAL | 0 refills | Status: DC

## 2022-04-05 MED ORDER — PREDNISONE 20 MG PO TABS: 40.0000 mg | ORAL_TABLET | Freq: Every day | ORAL | 0 refills | Status: AC

## 2022-04-05 MED ORDER — PROMETHAZINE HCL 25 MG/ML IJ SOLN
25.0000 mg | Freq: Once | INTRAMUSCULAR | Status: AC
  Administered 2022-04-05: 25 mg via INTRAMUSCULAR

## 2022-04-05 MED ORDER — ONDANSETRON HCL 8 MG PO TABS: 8.0000 mg | ORAL_TABLET | Freq: Three times a day (TID) | ORAL | 0 refills | Status: DC | PRN

## 2022-04-05 NOTE — Discharge Instructions (Addendum)
Your COVID and influenza tests are negative.  You have an ear infection and likely a lung infection. Stop by the pharmacy to pick up your prescriptions.  Follow up with your primary care provider or gut doctor. Stop taking Miralax as it is causing diarrhea.  Use miralax if you have

## 2022-04-05 NOTE — ED Triage Notes (Signed)
Pt c/o left ear pain, mid back pain, cough and vomiting x 2 weeks. Patient states she took an at home covid test 4 days ago and it was positive.

## 2022-04-05 NOTE — ED Provider Notes (Signed)
[facility]    CSN: [id number] Arrival date & time: 04/05/22  1533      History   Chief Complaint Chief Complaint  Patient presents with   Otalgia   Emesis   Back Pain    HPI Shelby Simmons is a 46 y.o. female.   HPI  Shelby Simmons presents for vomiting for the past 2 weeks.  Feels like she is swollen around her upper abdomen. She has upper abdominal pain, mid back pain that started over the weekend.  Left ear pain started 2 days ago.  When she takes Miralax  she is able to eat.  If she doesn't take the Miralax she doesn't go to the bathroom.  When she coughs she has a bowel movement as she has diarrhea. Cough since around Halloween.  States she had a COVID test was positive 4 days ago.      Past Medical History:  Diagnosis Date   Fibromyalgia    GERD (gastroesophageal reflux disease)    Hypercholesterolemia    Hypertension    Vertigo     There are no problems to display for this patient.   Past Surgical History:  Procedure Laterality Date   ABDOMINAL SURGERY     HERNIA REPAIR      OB History   No obstetric history on file.      Home Medications    Prior to Admission medications   Medication Sig Start Date End Date Taking? Authorizing Provider  azithromycin (ZITHROMAX Z-PAK) 250 MG tablet Take 1 tablet (250 mg total) by mouth daily. Take 2 tablets one the first day. 04/05/22  Yes Nahlia Hellmann, DO  DULoxetine (CYMBALTA) 60 MG capsule Take 60 mg by mouth daily.   Yes [provider]  lisinopril-hydrochlorothiazide (ZESTORETIC) 20-12.5 MG tablet Take 1 tablet by mouth daily.   Yes [provider]  ondansetron (ZOFRAN) 8 MG tablet Take 1 tablet (8 mg total) by mouth every 8 (eight) hours as needed for nausea or vomiting. 04/05/22  Yes Kemond Amorin, DO  predniSONE (DELTASONE) 20 MG tablet Take 2 tablets (40 mg total) by mouth daily for 5 days. 04/05/22 04/10/22 Yes Pasqualina Colasurdo, DO  traZODone (DESYREL) 50 MG tablet Take 50 mg by  mouth at bedtime.   Yes [provider]  albuterol (VENTOLIN HFA) 108 (90 Base) MCG/ACT inhaler Inhale 2 puffs into the lungs every 6 (six) hours as needed for wheezing or shortness of breath.    [provider]  naproxen (NAPROSYN) 500 MG tablet Take 1 tablet (500 mg total) by mouth 2 (two) times daily with a meal. 09/29/20   Joni Reining, PA-C  orphenadrine (NORFLEX) 100 MG tablet Take 1 tablet (100 mg total) by mouth 2 (two) times daily. 09/29/20   Joni Reining, PA-C    Family History History reviewed. No pertinent family history.  Social History Social History   Tobacco Use   Smoking status: Never   Smokeless tobacco: Never  Vaping Use   Vaping Use: Never used  Substance Use Topics   Alcohol use: Yes   Drug use: Never     Allergies   Amoxicillin, Shellfish allergy, and Latex   Review of Systems Review of Systems : negative unless otherwise stated in HPI.      Physical Exam Triage Vital Signs ED Triage Vitals  Enc Vitals Group     BP 04/05/22 1600 120/84     Pulse Rate 04/05/22 1600 84     Resp 04/05/22 1600 18  Temp 04/05/22 1600 98.8 F (37.1 C)     Temp Source 04/05/22 1600 Oral     SpO2 04/05/22 1600 97 %     Weight --      Height --      Head Circumference --      Peak Flow --      Pain Score 04/05/22 1558 8     Pain Loc --      Pain Edu? --      Excl. in GC? --    No data found.  Updated Vital Signs BP 120/84 (BP Location: Right Arm)   Pulse 84   Temp 98.8 F (37.1 C) (Oral)   Resp 18   LMP  (LMP Unknown)   SpO2 97%   Visual Acuity Right Eye Distance:   Left Eye Distance:   Bilateral Distance:    Right Eye Near:   Left Eye Near:    Bilateral Near:     Physical Exam  GEN: alert, well appearing female, in no acute distress     HENT:  mucus membranes moist, oropharyngeal without lesions or erythema,  nares patent, no nasal discharge, left TM opaque and erythematous, right TM normal EYES:   pupils equal and  reactive, EOM intact NECK:  supple, normal ROM, no lymphadenopathy  RESP:  clear to auscultation bilaterally, coarse breathe sounds, no wheezing CVS:   regular rate and rhythm, no murmur, distal pulses intact   ABD:  soft, generalized tenderness; bowel sounds present; no palpable masses,  EXT:   normal ROM, atraumatic, no edema  NEURO:  normal without focal findings,  speech normal, alert and oriented, strength 5/5 bilateral lower extremities   Skin:   warm and dry, no rash, normal skin turgor Psych: Normal affect, appropriate speech and behavior    UC Treatments / Results  Labs (all labs ordered are listed, but only abnormal results are displayed) Labs Reviewed  URINALYSIS, ROUTINE W REFLEX MICROSCOPIC - Abnormal; Notable for the following components:      Result Value   Specific Gravity, Urine <1.005 (*)    All other components within normal limits  COMPREHENSIVE METABOLIC PANEL - Abnormal; Notable for the following components:   Sodium 134 (*)    Potassium 3.4 (*)    All other components within normal limits  RESP PANEL BY RT-PCR (FLU A&B, COVID) ARPGX2  CBC WITH DIFFERENTIAL/PLATELET  LIPASE, BLOOD    EKG   Radiology No results found.  Procedures Procedures (including critical care time)  Medications Ordered in UC Medications  promethazine (PHENERGAN) injection 25 mg (25 mg Intramuscular Given 04/05/22 1737)    Initial Impression / Assessment and Plan / UC Course  I have reviewed the triage vital signs and the nursing notes.  Pertinent labs & imaging results that were available during my care of the patient were reviewed by me and considered in my medical decision making (see chart for details).     Patient is a 46 year old female with intermittent abdominal pain, vomiting and diarrhea.  She reports that she had a positive home COVID test 4 days ago but in conversation with her she says the test only showed 1 line at the C.  I do not believe she properly read the  COVID test so this was repeated with her permission this evening.  COVID and influenza was both negative.  Urinalysis was unremarkable for hematuria and acute cystitis.  CBC did not show any leukocytosis or anemia.  Lipase was normal therefore doubt pancreatitis.  She had a mild hyponatremia (Na 134) and hypokalemia (K 3.4).  This may be secondary to her diarrhea and vomiting.  No bowel meds and kidney and liver function test.  Her blood sugar was normal at 94.   I suspect that the diarrhea may be secondary to her use of MiraLAX.  Advised her to not use this medication for the next couple of days and to resume if she starts to have straining with bowel movements or pellet-like stools.  She was given Phenergan IM for nausea and vomiting.  This provided her some relief.  She has evidence of acute otitis media on the left.  Given her ongoing cough and nasal congestion we will also treat for sinobronchitis with steroids and antibiotics as below.  Return and ED precautions given and patient and her husband voiced understanding. Discussed MDM, treatment plan and plan for follow-up with patient/parent who agrees with plan.    Final Clinical Impressions(s) / UC Diagnoses   Final diagnoses:  Left acute otitis media  Sinobronchitis     Discharge Instructions      Your COVID and influenza tests are negative.  You have an ear infection and likely a lung infection. Stop by the pharmacy to pick up your prescriptions.  Follow up with your primary care provider or gut doctor. Stop taking Miralax as it is causing diarrhea.  Use miralax if you have      ED Prescriptions     Medication Sig Dispense Auth. Provider   ondansetron (ZOFRAN) 8 MG tablet Take 1 tablet (8 mg total) by mouth every 8 (eight) hours as needed for nausea or vomiting. 20 tablet Prentiss Hammett, DO   azithromycin (ZITHROMAX Z-PAK) 250 MG tablet Take 1 tablet (250 mg total) by mouth daily. Take 2 tablets one the first day. 6 tablet  Temara Lanum, DO   predniSONE (DELTASONE) 20 MG tablet Take 2 tablets (40 mg total) by mouth daily for 5 days. 10 tablet Katha Cabal, DO      PDMP not reviewed this encounter.   Katha Cabal, DO 04/07/22 1830

## 2022-04-24 DIAGNOSIS — J45909 Unspecified asthma, uncomplicated: Secondary | ICD-10-CM | POA: Insufficient documentation

## 2022-04-24 DIAGNOSIS — R202 Paresthesia of skin: Secondary | ICD-10-CM | POA: Insufficient documentation

## 2022-04-24 DIAGNOSIS — F32A Depression, unspecified: Secondary | ICD-10-CM | POA: Insufficient documentation

## 2022-04-25 DIAGNOSIS — J189 Pneumonia, unspecified organism: Secondary | ICD-10-CM | POA: Insufficient documentation

## 2022-05-23 ENCOUNTER — Ambulatory Visit
Admission: EM | Admit: 2022-05-23 | Discharge: 2022-05-23 | Disposition: A | Discharge status: Home / Self Care | Payer: 59 | Attending: Physician Assistant | Admitting: Physician Assistant

## 2022-05-23 ENCOUNTER — Ambulatory Visit (INDEPENDENT_AMBULATORY_CARE_PROVIDER_SITE_OTHER): Payer: 59

## 2022-05-23 DIAGNOSIS — R0602 Shortness of breath: Secondary | ICD-10-CM | POA: Diagnosis not present

## 2022-05-23 DIAGNOSIS — J101 Influenza due to other identified influenza virus with other respiratory manifestations: Secondary | ICD-10-CM | POA: Diagnosis not present

## 2022-05-23 DIAGNOSIS — Z1152 Encounter for screening for COVID-19: Secondary | ICD-10-CM | POA: Diagnosis not present

## 2022-05-23 DIAGNOSIS — R0789 Other chest pain: Secondary | ICD-10-CM | POA: Diagnosis not present

## 2022-05-23 DIAGNOSIS — J111 Influenza due to unidentified influenza virus with other respiratory manifestations: Secondary | ICD-10-CM

## 2022-05-23 DIAGNOSIS — R051 Acute cough: Secondary | ICD-10-CM | POA: Insufficient documentation

## 2022-05-23 DIAGNOSIS — Z79899 Other long term (current) drug therapy: Secondary | ICD-10-CM | POA: Insufficient documentation

## 2022-05-23 DIAGNOSIS — I119 Hypertensive heart disease without heart failure: Secondary | ICD-10-CM | POA: Insufficient documentation

## 2022-05-23 DIAGNOSIS — R062 Wheezing: Secondary | ICD-10-CM

## 2022-05-23 LAB — RESP PANEL BY RT-PCR (RSV, FLU A&B, COVID)  RVPGX2
Influenza A by PCR: POSITIVE — AB
Influenza B by PCR: NEGATIVE
Resp Syncytial Virus by PCR: NEGATIVE
SARS Coronavirus 2 by RT PCR: NEGATIVE

## 2022-05-23 MED ORDER — OSELTAMIVIR PHOSPHATE 75 MG PO CAPS: 75.0000 mg | ORAL_CAPSULE | Freq: Two times a day (BID) | ORAL | 0 refills | Status: AC

## 2022-05-23 MED ORDER — CHERATUSSIN AC 100-10 MG/5ML PO SOLN: 10.0000 mL | Freq: Three times a day (TID) | ORAL | 0 refills | Status: DC | PRN

## 2022-05-23 NOTE — ED Provider Notes (Signed)
[facility]    CSN: [id number] Arrival date & time: 05/23/22  1351      History   Chief Complaint Chief Complaint  Patient presents with   Chest Pain    HPI Shelby Simmons is a 47 y.o. female presenting for 3-day history of fatigue, body aches, chills, cough, congestion, left-sided ear pain, left-sided rib pain and shortness of breath.  Patient reports that she was treated for pneumonia last month and feels similar to that.  She also reports that her grandchildren are sick with similar symptoms and have fever.  She is unsure of any known exposure to flu, COVID or RSV.  She has been taking over-the-counter cough medicine without relief.  Her medical history stemming for hypertension, hyperlipidemia, GERD and obesity.  HPI  Past Medical History:  Diagnosis Date   Fibromyalgia    GERD (gastroesophageal reflux disease)    Hypercholesterolemia    Hypertension    Vertigo     There are no problems to display for this patient.   Past Surgical History:  Procedure Laterality Date   ABDOMINAL SURGERY     HERNIA REPAIR      OB History   No obstetric history on file.      Home Medications    Prior to Admission medications   Medication Sig Start Date End Date Taking? Authorizing Provider  guaiFENesin-codeine (CHERATUSSIN AC) 100-10 MG/5ML syrup Take 10 mLs by mouth 3 (three) times daily as needed for cough. 05/23/22  Yes Danton Clap, PA-C  oseltamivir (TAMIFLU) 75 MG capsule Take 1 capsule (75 mg total) by mouth every 12 (twelve) hours for 5 days. 05/23/22 05/28/22 Yes Danton Clap, PA-C  albuterol (VENTOLIN HFA) 108 (90 Base) MCG/ACT inhaler Inhale 2 puffs into the lungs every 6 (six) hours as needed for wheezing or shortness of breath.    [provider]  DULoxetine (CYMBALTA) 60 MG capsule Take 60 mg by mouth daily.    [provider]  lisinopril-hydrochlorothiazide (ZESTORETIC) 20-12.5 MG tablet Take 1 tablet by mouth daily.    [provider]  naproxen (NAPROSYN) 500 MG tablet Take 1 tablet (500 mg total) by mouth 2 (two) times daily with a meal. 09/29/20   Sable Feil, PA-C  ondansetron (ZOFRAN) 8 MG tablet Take 1 tablet (8 mg total) by mouth every 8 (eight) hours as needed for nausea or vomiting. 04/05/22   Lyndee Hensen, DO  orphenadrine (NORFLEX) 100 MG tablet Take 1 tablet (100 mg total) by mouth 2 (two) times daily. 09/29/20   Sable Feil, PA-C  traZODone (DESYREL) 50 MG tablet Take 50 mg by mouth at bedtime.    [provider]    Family History History reviewed. No pertinent family history.  Social History Social History   Tobacco Use   Smoking status: Never   Smokeless tobacco: Never  Vaping Use   Vaping Use: Never used  Substance Use Topics   Alcohol use: Yes   Drug use: Never     Allergies   Amoxicillin, Shellfish allergy, and Latex   Review of Systems Review of Systems  Constitutional:  Positive for chills and fatigue. Negative for diaphoresis and fever.  HENT:  Positive for congestion, ear pain and rhinorrhea. Negative for sinus pressure, sinus pain and sore throat.   Respiratory:  Positive for cough and shortness of breath.   Cardiovascular:  Positive for chest pain.  Gastrointestinal:  Negative for abdominal pain, nausea and vomiting.  Musculoskeletal:  Positive for  myalgias. Negative for arthralgias.  Skin:  Negative for rash.  Neurological:  Negative for weakness and headaches.  Hematological:  Negative for adenopathy.     Physical Exam Triage Vital Signs ED Triage Vitals  Enc Vitals Group     BP 05/23/22 1454 108/75     Pulse Rate 05/23/22 1454 95     Resp 05/23/22 1454 18     Temp 05/23/22 1454 99.2 F (37.3 C)     Temp Source 05/23/22 1454 Oral     SpO2 05/23/22 1454 98 %     Weight --      Height --      Head Circumference --      Peak Flow --      Pain Score 05/23/22 1456 6     Pain Loc --      Pain Edu? --      Excl. in GC? --    No data  found.  Updated Vital Signs BP 108/75 (BP Location: Right Arm)   Pulse 95   Temp 99.2 F (37.3 C) (Oral)   Resp 18   LMP 05/02/2022 (Approximate)   SpO2 98%      Physical Exam Vitals and nursing note reviewed.  Constitutional:      General: She is not in acute distress.    Appearance: Normal appearance. She is not ill-appearing or toxic-appearing.  HENT:     Head: Normocephalic and atraumatic.     Right Ear: Tympanic membrane, ear canal and external ear normal.     Left Ear: Tympanic membrane, ear canal and external ear normal.     Nose: Congestion present.     Mouth/Throat:     Mouth: Mucous membranes are moist.     Pharynx: Oropharynx is clear.  Eyes:     General: No scleral icterus.       Right eye: No discharge.        Left eye: No discharge.     Conjunctiva/sclera: Conjunctivae normal.  Cardiovascular:     Rate and Rhythm: Normal rate and regular rhythm.     Heart sounds: Normal heart sounds.  Pulmonary:     Effort: Pulmonary effort is normal. No respiratory distress.     Breath sounds: Normal breath sounds. No wheezing, rhonchi or rales.  Chest:     Chest wall: Tenderness (TTP along left anterior and lateral ribs) present.  Musculoskeletal:     Cervical back: Neck supple.  Skin:    General: Skin is dry.  Neurological:     General: No focal deficit present.     Mental Status: She is alert. Mental status is at baseline.     Motor: No weakness.     Gait: Gait normal.  Psychiatric:        Mood and Affect: Mood normal.        Behavior: Behavior normal.        Thought Content: Thought content normal.      UC Treatments / Results  Labs (all labs ordered are listed, but only abnormal results are displayed) Labs Reviewed  RESP PANEL BY RT-PCR (RSV, FLU A&B, COVID)  RVPGX2 - Abnormal; Notable for the following components:      Result Value   Influenza A by PCR POSITIVE (*)    All other components within normal limits    EKG   Radiology DG Chest 2  View  Result Date: 05/23/2022 CLINICAL DATA:  Cough, shortness of breath, left chest pain EXAM: CHEST - 2  VIEW COMPARISON:  09/29/2020 FINDINGS: Mild cardiomegaly. Normal mediastinal contours. No focal pulmonary opacity. No pleural effusion or pneumothorax. No acute osseous abnormality. IMPRESSION: Mild cardiomegaly. No acute cardiopulmonary process. Electronically Signed   By: Merilyn Baba M.D.   On: 05/23/2022 15:28    Procedures ED EKG  Date/Time: 05/23/2022 4:57 PM  Performed by: Danton Clap, PA-C Authorized by: Danton Clap, PA-C   Previous ECG:    Previous ECG:  Unavailable Interpretation:    Interpretation: non-specific   Rate:    ECG rate:  73   ECG rate assessment: normal   Rhythm:    Rhythm: sinus rhythm   Ectopy:    Ectopy: none   QRS:    QRS axis:  Normal   QRS intervals:  Normal   QRS conduction: normal   ST segments:    ST segments:  Normal T waves:    T waves: normal   Comments:     Normal sinus rhythm and regular rate. No ST or T wave changes  (including critical care time)  Medications Ordered in UC Medications - No data to display  Initial Impression / Assessment and Plan / UC Course  I have reviewed the triage vital signs and the nursing notes.  Pertinent labs & imaging results that were available during my care of the patient were reviewed by me and considered in my medical decision making (see chart for details).   29 female presents for chills, fatigue, cough, left-sided rib pain, left ear pain, body aches and shortness of breath x 3 days.  Has been around her grandchildren who have similar symptoms and fever.  They are being seen in the urgent care today as well.  Patient also reports that she had pneumonia last month and was treated with Levaquin.  She says she feels similar to when she had pneumonia.  Vitals are stable.  She is overall well-appearing.  She is in no acute distress.  On exam she has nasal congestion.  No evidence of ear  infection.  Throat is clear.  Chest clear to auscultation and heart regular rate and rhythm.  She does have some tenderness along the left anterior and lateral ribs.  A chest x-ray today is not consistent with any infiltrates or acute abnormalities.  Obtaining EKG given the complaint of left-sided rib pain as well as respiratory panel.  EKG shows normal sinus rhythm and regular rate.  No ST or T wave changes.  Respiratory panel is positive for influenza A.  Discussed with patient.  Advised her I am unsure Tamiflu will help since she has been ill for 3 days as.  She is interested in trying it so I have sent it to pharmacy.  Additionally, I sent Cheratussin after reviewing controlled substance database.  Encouraged her to increase rest and fluids.  Advised her close monitoring and if she feels that her shortness of breath or chest pain worsen she should call EMS or go to the emergency department.   Final Clinical Impressions(s) / UC Diagnoses   Final diagnoses:  Influenza  Acute cough  Shortness of breath  Chest wall pain     Discharge Instructions      -You have the flu. - You do not have pneumonia. - I sent Tamiflu to the pharmacy.  Not sure if it will help.  Supposed to be started in the first 2 days and he been sick a little longer. - I sent cough medication.  You should increase your rest  and fluids and take ibuprofen and or Tylenol as needed. - If your chest pain or shortness of breath worsen you should go to the emergency department.     ED Prescriptions     Medication Sig Dispense Auth. Provider   oseltamivir (TAMIFLU) 75 MG capsule Take 1 capsule (75 mg total) by mouth every 12 (twelve) hours for 5 days. 10 capsule Eusebio Friendly B, PA-C   guaiFENesin-codeine (CHERATUSSIN AC) 100-10 MG/5ML syrup Take 10 mLs by mouth 3 (three) times daily as needed for cough. 120 mL Shirlee Latch, PA-C      I have reviewed the PDMP during this encounter.   Shirlee Latch,  PA-C 05/23/22 1659

## 2022-05-23 NOTE — Discharge Instructions (Signed)
-  You have the flu. - You do not have pneumonia. - I sent Tamiflu to the pharmacy.  Not sure if it will help.  Supposed to be started in the first 2 days and he been sick a little longer. - I sent cough medication.  You should increase your rest and fluids and take ibuprofen and or Tylenol as needed. - If your chest pain or shortness of breath worsen you should go to the emergency department.

## 2022-05-23 NOTE — ED Triage Notes (Signed)
Pt reports coughing, sob, left ear pain, sharp left side rib cage pain x 3 days. Took cough meds but no relief.   Pt is concerned about pneumonia due to her having it last moth. She is SOB like last time.

## 2023-01-08 ENCOUNTER — Other Ambulatory Visit: Payer: Self-pay | Admitting: Student

## 2023-01-08 DIAGNOSIS — R079 Chest pain, unspecified: Secondary | ICD-10-CM

## 2023-05-15 ENCOUNTER — Other Ambulatory Visit: Payer: Self-pay | Admitting: Student

## 2023-05-15 DIAGNOSIS — R079 Chest pain, unspecified: Secondary | ICD-10-CM

## 2023-05-21 ENCOUNTER — Encounter
Admission: RE | Admit: 2023-05-21 | Discharge: 2023-05-21 | Disposition: A | Discharge status: Home / Self Care | Payer: 59 | Source: Ambulatory Visit | Attending: Student | Admitting: Student

## 2023-05-21 DIAGNOSIS — R079 Chest pain, unspecified: Secondary | ICD-10-CM | POA: Insufficient documentation

## 2023-05-21 LAB — NM MYOCAR MULTI W/SPECT W/WALL MOTION / EF
Estimated workload: 1
Exercise duration (min): 0 min
Exercise duration (sec): 0 s
LV dias vol: 38 mL (ref 46–106)
LV sys vol: 8 mL
MPHR: 172 {beats}/min
Nuc Stress EF: 79 %
Peak HR: 127 {beats}/min
Percent HR: 73 %
Rest HR: 74 {beats}/min
Rest Nuclear Isotope Dose: 10 mCi
SDS: 1
SRS: 1
SSS: 2
ST Depression (mm): 0 mm
Stress Nuclear Isotope Dose: 31.3 mCi
TID: 1.15

## 2023-05-21 MED ORDER — TECHNETIUM TC 99M TETROFOSMIN IV KIT
31.2700 | PACK | Freq: Once | INTRAVENOUS | Status: AC | PRN
  Administered 2023-05-21: 31.27 via INTRAVENOUS

## 2023-05-21 MED ORDER — TECHNETIUM TC 99M TETROFOSMIN IV KIT
10.0100 | PACK | Freq: Once | INTRAVENOUS | Status: AC | PRN
  Administered 2023-05-21: 10.01 via INTRAVENOUS

## 2023-05-21 MED ORDER — REGADENOSON 0.4 MG/5ML IV SOLN
0.4000 mg | Freq: Once | INTRAVENOUS | Status: AC
  Administered 2023-05-21: 0.4 mg via INTRAVENOUS

## 2023-08-01 ENCOUNTER — Ambulatory Visit
Admission: RE | Admit: 2023-08-01 | Discharge: 2023-08-01 | Disposition: A | Discharge status: Home / Self Care | Source: Ambulatory Visit | Attending: Student | Admitting: Student

## 2023-08-01 ENCOUNTER — Other Ambulatory Visit: Payer: Self-pay | Admitting: Student

## 2023-08-01 ENCOUNTER — Ambulatory Visit
Admission: RE | Admit: 2023-08-01 | Discharge: 2023-08-01 | Disposition: A | Discharge status: Home / Self Care | Attending: Student | Admitting: Student

## 2023-08-01 DIAGNOSIS — R52 Pain, unspecified: Secondary | ICD-10-CM

## 2023-08-07 ENCOUNTER — Encounter: Payer: Self-pay | Admitting: Internal Medicine

## 2023-08-14 ENCOUNTER — Encounter
Admission: RE | Disposition: A | Discharge status: Home / Self Care | Payer: Self-pay | Source: Home / Self Care | Attending: Internal Medicine

## 2023-08-14 ENCOUNTER — Ambulatory Visit
Admission: RE | Admit: 2023-08-14 | Discharge: 2023-08-14 | Disposition: A | Discharge status: Home / Self Care | Payer: 59 | Attending: Internal Medicine | Admitting: Internal Medicine

## 2023-08-14 ENCOUNTER — Ambulatory Visit: Admitting: Anesthesiology

## 2023-08-14 ENCOUNTER — Encounter: Payer: Self-pay | Admitting: Internal Medicine

## 2023-08-14 DIAGNOSIS — Z79899 Other long term (current) drug therapy: Secondary | ICD-10-CM | POA: Insufficient documentation

## 2023-08-14 DIAGNOSIS — K625 Hemorrhage of anus and rectum: Secondary | ICD-10-CM | POA: Diagnosis not present

## 2023-08-14 DIAGNOSIS — Z791 Long term (current) use of non-steroidal anti-inflammatories (NSAID): Secondary | ICD-10-CM | POA: Insufficient documentation

## 2023-08-14 DIAGNOSIS — K5900 Constipation, unspecified: Secondary | ICD-10-CM | POA: Insufficient documentation

## 2023-08-14 DIAGNOSIS — K449 Diaphragmatic hernia without obstruction or gangrene: Secondary | ICD-10-CM | POA: Insufficient documentation

## 2023-08-14 DIAGNOSIS — Z6841 Body Mass Index (BMI) 40.0 and over, adult: Secondary | ICD-10-CM | POA: Insufficient documentation

## 2023-08-14 DIAGNOSIS — I10 Essential (primary) hypertension: Secondary | ICD-10-CM | POA: Insufficient documentation

## 2023-08-14 DIAGNOSIS — E66813 Obesity, class 3: Secondary | ICD-10-CM | POA: Diagnosis not present

## 2023-08-14 DIAGNOSIS — M797 Fibromyalgia: Secondary | ICD-10-CM | POA: Diagnosis not present

## 2023-08-14 DIAGNOSIS — K222 Esophageal obstruction: Secondary | ICD-10-CM | POA: Diagnosis not present

## 2023-08-14 DIAGNOSIS — R1310 Dysphagia, unspecified: Secondary | ICD-10-CM | POA: Diagnosis not present

## 2023-08-14 DIAGNOSIS — K5730 Diverticulosis of large intestine without perforation or abscess without bleeding: Secondary | ICD-10-CM | POA: Diagnosis not present

## 2023-08-14 DIAGNOSIS — R194 Change in bowel habit: Secondary | ICD-10-CM | POA: Diagnosis present

## 2023-08-14 HISTORY — DX: Sleep apnea, unspecified: G47.30

## 2023-08-14 HISTORY — PX: COLONOSCOPY WITH PROPOFOL: SHX5780

## 2023-08-14 HISTORY — PX: MALONEY DILATION: SHX5535

## 2023-08-14 HISTORY — PX: ESOPHAGOGASTRODUODENOSCOPY (EGD) WITH PROPOFOL: SHX5813

## 2023-08-14 LAB — POCT PREGNANCY, URINE: Preg Test, Ur: NEGATIVE

## 2023-08-14 SURGERY — COLONOSCOPY WITH PROPOFOL
Anesthesia: General

## 2023-08-14 MED ORDER — PROPOFOL 500 MG/50ML IV EMUL
INTRAVENOUS | Status: DC | PRN
  Administered 2023-08-14: 120 ug/kg/min via INTRAVENOUS

## 2023-08-14 MED ORDER — SODIUM CHLORIDE 0.9 % IV SOLN: INTRAVENOUS | Status: DC

## 2023-08-14 MED ORDER — PROPOFOL 10 MG/ML IV BOLUS
INTRAVENOUS | Status: DC | PRN
  Administered 2023-08-14: 70 mg via INTRAVENOUS
  Administered 2023-08-14: 30 mg via INTRAVENOUS

## 2023-08-14 MED ORDER — MIDAZOLAM HCL 2 MG/2ML IJ SOLN
INTRAMUSCULAR | Status: AC
  Filled 2023-08-14: qty 2

## 2023-08-14 MED ORDER — GLYCOPYRROLATE 0.2 MG/ML IJ SOLN
INTRAMUSCULAR | Status: DC | PRN
  Administered 2023-08-14: .2 mg via INTRAVENOUS

## 2023-08-14 MED ORDER — MIDAZOLAM HCL 5 MG/5ML IJ SOLN
INTRAMUSCULAR | Status: DC | PRN
  Administered 2023-08-14: 2 mg via INTRAVENOUS

## 2023-08-14 MED ORDER — LIDOCAINE 2% (20 MG/ML) 5 ML SYRINGE
INTRAMUSCULAR | Status: DC | PRN
  Administered 2023-08-14: 20 mg via INTRAVENOUS

## 2023-08-14 NOTE — Op Note (Signed)
 [facility] Patient Name: Shelby Simmons Procedure Date: 08/14/2023 9:41 AM MRN: [id number] Account #: [id number] Date of Birth: 1975/08/02 Admit Type: Outpatient Age: 48 Room: [facility] ENDO ROOM 1 Gender: Female Note Status: Finalized Instrument Name: Prentice Docker [id number] Procedure:             Colonoscopy Indications:           Change in bowel habits, Constipation Providers:             Boykin Nearing. Jhanae Jaskowiak MD, MD Medicines:             Propofol per Anesthesia Complications:         No immediate complications. Estimated blood loss: None. Procedure:             Pre-Anesthesia Assessment:                        - The risks and benefits of the procedure and the                         sedation options and risks were discussed with the                         patient. All questions were answered and informed                         consent was obtained.                        - Patient identification and proposed procedure were                         verified prior to the procedure by the nurse. The                         procedure was verified in the procedure room.                        - ASA Grade Assessment: III - A patient with severe                         systemic disease.                        - After reviewing the risks and benefits, the patient                         was deemed in satisfactory condition to undergo the                         procedure.                        After obtaining informed consent, the colonoscope was                         passed under direct vision. Throughout the procedure,                         the patient's blood pressure, pulse, and oxygen  saturations were monitored continuously. The                         Colonoscope was introduced through the anus and                         advanced to the the cecum, identified by appendiceal                         orifice and ileocecal valve.  The colonoscopy was                         technically difficult and complex due to restricted                         mobility of the colon. Successful completion of the                         procedure was aided by using scope torsion. The                         patient tolerated the procedure well. The quality of                         the bowel preparation was good. The ileocecal valve,                         appendiceal orifice, and rectum were photographed. Findings:      The perianal and digital rectal examinations were normal. Pertinent       negatives include normal sphincter tone and no palpable rectal lesions.      Many medium-mouthed and small-mouthed diverticula were found in the       entire colon. There was no evidence of diverticular bleeding.      The exam was otherwise without abnormality on direct and retroflexion       views. Impression:            - Mild diverticulosis in the entire examined colon.                         There was no evidence of diverticular bleeding.                        - The examination was otherwise normal on direct and                         retroflexion views.                        - No specimens collected. Recommendation:        - Await pathology results from EGD, also performed                         today.                        - Monitor results to esophageal dilation                        -  Patient has a contact number available for                         emergencies. The signs and symptoms of potential                         delayed complications were discussed with the patient.                         Return to normal activities tomorrow. Written                         discharge instructions were provided to the patient.                        - Resume previous diet.                        - Continue present medications.                        - Repeat colonoscopy in 10 years for screening                          purposes.                        - Follow up with Tawni Pummel, PA-C at [facility]. [phone].                        - Telephone GI office to schedule appointment in 4                         months.                        - The findings and recommendations were discussed with                         the patient. Procedure Code(s):     --- Professional ---                        [phone], Colonoscopy, flexible; diagnostic, including                         collection of specimen(s) by brushing or washing, when                         performed (separate procedure) Diagnosis Code(s):     --- Professional ---                        K57.30, Diverticulosis of large intestine without                         perforation or abscess without bleeding  K59.00, Constipation, unspecified                        R19.4, Change in bowel habit CPT copyright 2022 American [employer] Association. All rights reserved. The codes documented in this report are preliminary and upon coder review may  be revised to meet current compliance requirements. Stanton Kidney MD, MD 08/14/2023 10:28:29 AM This report has been signed electronically. Number of Addenda: 0 Note Initiated On: 08/14/2023 9:41 AM Scope Withdrawal Time: 0 hours 6 minutes 54 seconds  Total Procedure Duration: 0 hours 12 minutes 15 seconds  Estimated Blood Loss:  Estimated blood loss: none.      [facility]

## 2023-08-14 NOTE — Anesthesia Postprocedure Evaluation (Signed)
 Anesthesia Post Note  Patient: Shelby Simmons  Procedure(s) Performed: COLONOSCOPY WITH PROPOFOL ESOPHAGOGASTRODUODENOSCOPY (EGD) WITH PROPOFOL DILATION, ESOPHAGUS, USING MALONEY DILATOR  Patient location during evaluation: PACU Anesthesia Type: General Level of consciousness: awake and alert, oriented and patient cooperative Pain management: pain level controlled Vital Signs Assessment: post-procedure vital signs reviewed and stable Respiratory status: spontaneous breathing, nonlabored ventilation and respiratory function stable Cardiovascular status: blood pressure returned to baseline and stable Postop Assessment: adequate PO intake Anesthetic complications: no   No notable events documented.   Last Vitals:  Vitals:   08/14/23 1038 08/14/23 1050  BP: 120/83 110/78  Pulse: (!) 114 (!) 114  Resp: (!) 27 20  Temp:    SpO2: 97% 96%    Last Pain:  Vitals:   08/14/23 1038  TempSrc:   PainSc: 0-No pain                 Reed Breech

## 2023-08-14 NOTE — Anesthesia Preprocedure Evaluation (Addendum)
 Anesthesia Evaluation  Patient identified by MRN, date of birth, ID band Patient awake    Reviewed: Allergy & Precautions, NPO status , Patient's Chart, lab work & pertinent test results  History of Anesthesia Complications Negative for: history of anesthetic complications  Airway Mallampati: IV   Neck ROM: Full    Dental  (+) Chipped   Pulmonary neg pulmonary ROS   Pulmonary exam normal breath sounds clear to auscultation       Cardiovascular hypertension, Normal cardiovascular exam Rhythm:Regular Rate:Normal  ECG 12/06/22:  NORMAL SINUS RHYTHM  NONSPECIFIC ST AND T WAVE ABNORMALITY  BORDERLINE ECG  WHEN COMPARED WITH ECG OF 24-Apr-2022 16:13,  NO SIGNIFICANT CHANGE WAS FOUND   Myocardial perfusion 05/21/23:    Normal pharmacologic myocardial perfusion stress test without evidence of significant ischemia or scar.   Left ventricular systolic function is normal (LVEF > 65%).   No significant coronary artery calcification is seen on the attenuation correction CT.   This is a low risk study.    Neuro/Psych  Headaches Vertigo     GI/Hepatic ,GERD  ,,  Endo/Other    Class 3 obesity  Renal/GU negative Renal ROS     Musculoskeletal  (+)  Fibromyalgia -  Abdominal   Peds  Hematology negative hematology ROS (+)   Anesthesia Other Findings   Reproductive/Obstetrics                             Anesthesia Physical Anesthesia Plan  ASA: 3  Anesthesia Plan: General   Post-op Pain Management:    Induction: Intravenous  PONV Risk Score and Plan: 3 and Propofol infusion, TIVA and Treatment may vary due to age or medical condition  Airway Management Planned: Natural Airway  Additional Equipment:   Intra-op Plan:   Post-operative Plan:   Informed Consent: I have reviewed the patients History and Physical, chart, labs and discussed the procedure including the risks, benefits and  alternatives for the proposed anesthesia with the patient or authorized representative who has indicated his/her understanding and acceptance.       Plan Discussed with: CRNA  Anesthesia Plan Comments: (LMA/GETA backup discussed.  Patient consented for risks of anesthesia including but not limited to:  - adverse reactions to medications - damage to eyes, teeth, lips or other oral mucosa - nerve damage due to positioning  - sore throat or hoarseness - damage to heart, brain, nerves, lungs, other parts of body or loss of life  Informed patient about role of CRNA in peri- and intra-operative care.  Patient voiced understanding.)        Anesthesia Quick Evaluation

## 2023-08-14 NOTE — Interval H&P Note (Signed)
 History and Physical Interval Note:  08/14/2023 9:45 AM  Rica Records  has presented today for surgery, with the diagnosis of K59.09 (ICD-10-CM) - Chronic constipation K21.9 (ICD-10-CM) - Gastroesophageal reflux disease, unspecified whether esophagitis present R13.10 (ICD-10-CM) - Dysphagia, unspecified type R11.0 (ICD-10-CM) - Nausea.  The various methods of treatment have been discussed with the patient and family. After consideration of risks, benefits and other options for treatment, the patient has consented to  Procedure(s): COLONOSCOPY WITH PROPOFOL (N/A) ESOPHAGOGASTRODUODENOSCOPY (EGD) WITH PROPOFOL (N/A) as a surgical intervention.  The patient's history has been reviewed, patient examined, no change in status, stable for surgery.  I have reviewed the patient's chart and labs.  Questions were answered to the patient's satisfaction.     [place], [place]

## 2023-08-14 NOTE — H&P (Signed)
 Outpatient short stay form Pre-procedure 08/14/2023 9:42 AM Tanasha Menees K. Norma Fredrickson, M.D.  Primary Physician: Diana Eves, M.D. [facility])  Reason for visit:    History of present illness:  Ms. Krawiec reports having dysphagia, can occur with solids or pills. She also endorses cough with liquids at times. She is usually able to pass the foods and denies having to regurgitate. She feels this has been ongoing about a year. She continues to have reflux symptoms despite Nexium 40 mg daily. She also endorses issues with a knot in her stomach area that has been there for a few years. She reports that this can be uncomfortable at times.  She endorses some issues with nausea and vomiting, she feels this can occur before or after meals. She endorses she started [facility] 2 months ago but had to stop because it worsened her GI symptoms. She has not done any triggers of the nausea vomiting. Symptoms have continued despite stopping wegovy.   She endorses some issues with constipation, reports she tried MiraLAX up to twice a day and made her feel very bloated with gas but did not help symptoms. She ultimately has been taking mag citrate as needed in addition to a stool softener. She typically will have a bowel movement daily but feels that she has a lot of pain with defecation. Sometimes feels stool will not pass through anal outlet. She has occasional rectal bleeding.  She denies tobacco alcohol. Denies goodys, BCs, etc but she does take Alka-Seltzer most nights. Mainly uses Tylenol. Denies any family history of colon polyps or colon cancer.     Current Facility-Administered Medications:    0.9 %  sodium chloride infusion, , Intravenous, Continuous, Stanley, Boykin Nearing, MD, Last Rate: 20 mL/hr at 08/14/23 0914, New Bag at 08/14/23 0914  Medications Prior to Admission  Medication Sig Dispense Refill Last Dose/Taking   cetirizine (ZYRTEC) 10 MG tablet Take 10 mg by mouth daily.   Taking   DULoxetine (CYMBALTA)  60 MG capsule Take 60 mg by mouth daily.   08/13/2023   esomeprazole (NEXIUM) 40 MG capsule Take 40 mg by mouth 2 (two) times daily before a meal.   08/14/2023 Morning   fluticasone (FLONASE) 50 MCG/ACT nasal spray Place into both nostrils daily.   Taking   gabapentin (NEURONTIN) 300 MG capsule Take 300 mg by mouth 3 (three) times daily.   08/13/2023   linaclotide (LINZESS) 72 MCG capsule Take 72 mcg by mouth daily before breakfast.   Taking   lisinopril-hydrochlorothiazide (ZESTORETIC) 20-12.5 MG tablet Take 1 tablet by mouth daily.   08/13/2023   methocarbamol (ROBAXIN) 500 MG tablet Take 500 mg by mouth 4 (four) times daily.   Taking   albuterol (VENTOLIN HFA) 108 (90 Base) MCG/ACT inhaler Inhale 2 puffs into the lungs every 6 (six) hours as needed for wheezing or shortness of breath.      guaiFENesin-codeine (CHERATUSSIN AC) 100-10 MG/5ML syrup Take 10 mLs by mouth 3 (three) times daily as needed for cough. 120 mL 0    naproxen (NAPROSYN) 500 MG tablet Take 1 tablet (500 mg total) by mouth 2 (two) times daily with a meal. (Patient not taking: Reported on 08/14/2023) 20 tablet 0 Not Taking   ondansetron (ZOFRAN) 8 MG tablet Take 1 tablet (8 mg total) by mouth every 8 (eight) hours as needed for nausea or vomiting. 20 tablet 0    orphenadrine (NORFLEX) 100 MG tablet Take 1 tablet (100 mg total) by mouth 2 (two) times daily. (Patient  not taking: Reported on 08/14/2023) 10 tablet 0 Not Taking   traZODone (DESYREL) 50 MG tablet Take 50 mg by mouth at bedtime.        Allergies  Allergen Reactions   Amoxicillin Nausea And Vomiting   Shellfish Allergy     Feels like something stuck in throat   Latex Rash     Past Medical History:  Diagnosis Date   Fibromyalgia    GERD (gastroesophageal reflux disease)    Hypercholesterolemia    Hypertension    Sleep apnea    Vertigo     Review of systems:  Otherwise negative.    Physical Exam  Gen: Alert, oriented. Appears stated age.  HEENT: [place]/AT.  PERRLA. Lungs: CTA, no wheezes. CV: RR nl S1, S2. Abd: soft, benign, no masses. BS+ Ext: No edema. Pulses 2+    Planned procedures: Proceed with EGD and colonoscopy. The patient understands the nature of the planned procedure, indications, risks, alternatives and potential complications including but not limited to bleeding, infection, perforation, damage to internal organs and possible oversedation/side effects from anesthesia. The patient agrees and gives consent to proceed.  Please refer to procedure notes for findings, recommendations and patient disposition/instructions.     Laquita Harlan K. Norma Fredrickson, M.D. Gastroenterology 08/14/2023  9:42 AM

## 2023-08-14 NOTE — Transfer of Care (Signed)
 Immediate Anesthesia Transfer of Care Note  Patient: Shelby Simmons  Procedure(s) Performed: COLONOSCOPY WITH PROPOFOL ESOPHAGOGASTRODUODENOSCOPY (EGD) WITH PROPOFOL DILATION, ESOPHAGUS, USING MALONEY DILATOR  Patient Location: Endoscopy Unit  Anesthesia Type:General  Level of Consciousness: drowsy  Airway & Oxygen Therapy: Patient Spontanous Breathing  Post-op Assessment: Report given to RN and Post -op Vital signs reviewed and stable  Post vital signs: Reviewed  Last Vitals:  Vitals Value Taken Time  BP 118/74 08/14/23 1027  Temp 35.6 C 08/14/23 1026  Pulse 122 08/14/23 1027  Resp 26 08/14/23 1027  SpO2 98 % 08/14/23 1027  Vitals shown include unfiled device data.  Last Pain:  Vitals:   08/14/23 1026  TempSrc: Temporal  PainSc: Asleep         Complications: No notable events documented.

## 2023-08-14 NOTE — Op Note (Signed)
 [facility]astroenterology Patient Name: Shelby Simmons Procedure Date: 08/14/2023 9:41 AM MRN: [id number] Account #: [id number] Date of Birth: 10-Apr-1976 Admit Type: Outpatient Age: 48 Room: [facility] ENDO ROOM 1 Gender: Female Note Status: Finalized Instrument Name: Upper Endoscope [id number] Procedure:             Upper GI endoscopy Indications:           Dysphagia, Suspected esophageal reflux, Failure to                         respond to medical treatment Providers:             Boykin Nearing. Silvina Hackleman MD, MD Medicines:             Propofol per Anesthesia Complications:         No immediate complications. Estimated blood loss:                         Minimal. Procedure:             Pre-Anesthesia Assessment:                        - The risks and benefits of the procedure and the                         sedation options and risks were discussed with the                         patient. All questions were answered and informed                         consent was obtained.                        - Patient identification and proposed procedure were                         verified prior to the procedure by the nurse. The                         procedure was verified in the procedure room.                        - ASA Grade Assessment: III - A patient with severe                         systemic disease.                        - After reviewing the risks and benefits, the patient                         was deemed in satisfactory condition to undergo the                         procedure.                        After obtaining informed consent, the endoscope was  passed under direct vision. Throughout the procedure,                         the patient's blood pressure, pulse, and oxygen                         saturations were monitored continuously. The Endoscope                         was introduced through the mouth, and advanced to the                          third part of duodenum. The upper GI endoscopy was                         accomplished without difficulty. The patient tolerated                         the procedure well. Findings:      Normal mucosa was found in the entire esophagus. Biopsies were obtained       from the proximal and distal esophagus with cold forceps for histology       of suspected eosinophilic esophagitis. Estimated blood loss was minimal.      A non-obstructing Schatzki ring was found at the gastroesophageal       junction. The scope was withdrawn. Dilation was performed with a Maloney       dilator with mild resistance at 54 Fr.      A 1 cm hiatal hernia was present.      The exam of the stomach was otherwise normal.      The examined duodenum was normal. Impression:            - Normal mucosa was found in the entire esophagus.                        - Non-obstructing Schatzki ring. Dilated.                        - 1 cm hiatal hernia.                        - Normal examined duodenum.                        - Biopsies were taken with a cold forceps for                         evaluation of eosinophilic esophagitis. Recommendation:        - Await pathology results.                        - Monitor results to esophageal dilation                        - Proceed with colonoscopy Procedure Code(s):     --- Professional ---                        [id number], Esophagogastroduodenoscopy, flexible,  transoral; with biopsy, single or multiple                        43450, Dilation of esophagus, by unguided sound or                         bougie, single or multiple passes Diagnosis Code(s):     --- Professional ---                        R13.10, Dysphagia, unspecified                        K44.9, Diaphragmatic hernia without obstruction or                         gangrene                        K22.2, Esophageal obstruction CPT copyright 2022 American Medical Association. All rights  reserved. The codes documented in this report are preliminary and upon coder review may  be revised to meet current compliance requirements. Stanton Kidney MD, MD 08/14/2023 10:08:54 AM This report has been signed electronically. Number of Addenda: 0 Note Initiated On: 08/14/2023 9:41 AM Estimated Blood Loss:  Estimated blood loss was minimal.      [facility]

## 2023-08-15 ENCOUNTER — Encounter: Payer: Self-pay | Admitting: Internal Medicine

## 2023-08-15 LAB — SURGICAL PATHOLOGY

## 2023-09-12 ENCOUNTER — Ambulatory Visit
Admission: EM | Admit: 2023-09-12 | Discharge: 2023-09-12 | Disposition: A | Discharge status: Home / Self Care | Attending: Emergency Medicine | Admitting: Emergency Medicine

## 2023-09-12 DIAGNOSIS — M62830 Muscle spasm of back: Secondary | ICD-10-CM | POA: Diagnosis not present

## 2023-09-12 DIAGNOSIS — M5441 Lumbago with sciatica, right side: Secondary | ICD-10-CM

## 2023-09-12 DIAGNOSIS — Z8739 Personal history of other diseases of the musculoskeletal system and connective tissue: Secondary | ICD-10-CM | POA: Diagnosis not present

## 2023-09-12 MED ORDER — CYCLOBENZAPRINE HCL 5 MG PO TABS: 5.0000 mg | ORAL_TABLET | Freq: Three times a day (TID) | ORAL | 0 refills | Status: AC | PRN

## 2023-09-12 NOTE — ED Provider Notes (Signed)
 [facility]    CSN: [id number] Arrival date & time: 09/12/23  8657      History   Chief Complaint Chief Complaint  Patient presents with   Fever   Chills   Leg Pain    HPI Shelby Simmons is a 48 y.o. female.   48 year old female, Shelby Simmons, presents to urgent care for evaluation of right lower thigh pain radiates to buttock since Thursday(6 days). Pt reports fever and chills x 2 days, taking ibuprofen and tylenol for fever/chills/pain concern that pt has bug bite to buttock area.  Patient denies any loss of bowel or bladder, no loss of function, no saddle numbness,MAEWx4.  PMH: Fibromyalgia, GERD, hypercholesterolemia, hypertension,vertigo, sleep apnea  The history is provided by the patient. No language interpreter was used.    Past Medical History:  Diagnosis Date   Fibromyalgia    GERD (gastroesophageal reflux disease)    Hypercholesterolemia    Hypertension    Sleep apnea    Vertigo     Patient Active Problem List   Diagnosis Date Noted   Acute right-sided low back pain with right-sided sciatica 09/12/2023   History of fibromyalgia 09/12/2023   Muscle spasm of back 09/12/2023    Past Surgical History:  Procedure Laterality Date   ABDOMINAL SURGERY     COLONOSCOPY WITH PROPOFOL  N/A 08/14/2023   Procedure: COLONOSCOPY WITH PROPOFOL ;  Surgeon: Toledo, Alphonsus Jeans, MD;  Location: [facility] ENDOSCOPY;  Service: Gastroenterology;  Laterality: N/A;   ESOPHAGOGASTRODUODENOSCOPY (EGD) WITH PROPOFOL  N/A 08/14/2023   Procedure: ESOPHAGOGASTRODUODENOSCOPY (EGD) WITH PROPOFOL ;  Surgeon: Toledo, Alphonsus Jeans, MD;  Location: [facility] ENDOSCOPY;  Service: Gastroenterology;  Laterality: N/A;   HERNIA REPAIR     MALONEY DILATION  08/14/2023   Procedure: DILATION, ESOPHAGUS, USING MALONEY DILATOR;  Surgeon: Toledo, Alphonsus Jeans, MD;  Location: [facility] ENDOSCOPY;  Service: Gastroenterology;;    OB History   No obstetric history on file.      Home Medications    Prior to  Admission medications   Medication Sig Start Date End Date Taking? Authorizing Provider  cyclobenzaprine  (FLEXERIL ) 5 MG tablet Take 1 tablet (5 mg total) by mouth 3 (three) times daily as needed for up to 5 days for muscle spasms. 09/12/23 09/17/23 Yes Ruchama Kubicek, NP  albuterol (VENTOLIN HFA) 108 (90 Base) MCG/ACT inhaler Inhale 2 puffs into the lungs every 6 (six) hours as needed for wheezing or shortness of breath.    [provider]  cetirizine (ZYRTEC) 10 MG tablet Take 10 mg by mouth daily.    [provider]  DULoxetine (CYMBALTA) 60 MG capsule Take 60 mg by mouth daily.    [provider]  esomeprazole (NEXIUM) 40 MG capsule Take 40 mg by mouth 2 (two) times daily before a meal.    [provider]  fluticasone (FLONASE) 50 MCG/ACT nasal spray Place into both nostrils daily.    [provider]  gabapentin (NEURONTIN) 300 MG capsule Take 300 mg by mouth 3 (three) times daily.    [provider]  guaiFENesin-codeine (CHERATUSSIN AC) 100-10 MG/5ML syrup Take 10 mLs by mouth 3 (three) times daily as needed for cough. 05/23/22   Floydene Hy, PA-C  linaclotide (LINZESS) 72 MCG capsule Take 72 mcg by mouth daily before breakfast.    [provider]  lisinopril-hydrochlorothiazide (ZESTORETIC) 20-12.5 MG tablet Take 1 tablet by mouth daily.    [provider]  methocarbamol (ROBAXIN) 500 MG tablet Take 500 mg by mouth 4 (four) times  daily.    [provider]  naproxen  (NAPROSYN ) 500 MG tablet Take 1 tablet (500 mg total) by mouth 2 (two) times daily with a meal. Patient not taking: Reported on 08/14/2023 09/29/20   Marcina Severe, PA-C  ondansetron  (ZOFRAN ) 8 MG tablet Take 1 tablet (8 mg total) by mouth every 8 (eight) hours as needed for nausea or vomiting. 04/05/22   Brimage, Vondra, DO  orphenadrine  (NORFLEX ) 100 MG tablet Take 1 tablet (100 mg total) by mouth 2 (two) times daily. Patient not taking: Reported  on 08/14/2023 09/29/20   Marcina Severe, PA-C  traZODone (DESYREL) 50 MG tablet Take 50 mg by mouth at bedtime.    [provider]    Family History History reviewed. No pertinent family history.  Social History Social History   Tobacco Use   Smoking status: Never   Smokeless tobacco: Never  Vaping Use   Vaping status: Never Used  Substance Use Topics   Alcohol use: Yes   Drug use: Never     Allergies   Amoxicillin, Shellfish allergy, and Latex   Review of Systems Review of Systems  Constitutional:  Positive for chills and fever.  Musculoskeletal:  Positive for back pain and myalgias.  Skin: Negative.   All other systems reviewed and are negative.    Physical Exam Triage Vital Signs ED Triage Vitals [09/12/23 1010]  Encounter Vitals Group     BP      Systolic BP Percentile      Diastolic BP Percentile      Pulse      Resp      Temp      Temp src      SpO2      Weight      Height      Head Circumference      Peak Flow      Pain Score 7     Pain Loc      Pain Education      Exclude from Growth Chart    No data found.  Updated Vital Signs BP 104/71 (BP Location: Right Arm)   Pulse (!) 107   Temp 98.5 F (36.9 C) (Oral)   Resp 18   LMP 09/12/2023 (Approximate)   SpO2 95%   Visual Acuity Right Eye Distance:   Left Eye Distance:   Bilateral Distance:    Right Eye Near:   Left Eye Near:    Bilateral Near:     Physical Exam Vitals and nursing note reviewed.  Constitutional:      General: She is not in acute distress.    Appearance: She is well-developed and well-groomed.  HENT:     Head: Normocephalic and atraumatic.  Eyes:     Conjunctiva/sclera: Conjunctivae normal.  Cardiovascular:     Rate and Rhythm: Normal rate and regular rhythm.     Pulses:          Dorsalis pedis pulses are 2+ on the right side.     Heart sounds: No murmur heard. Pulmonary:     Effort: Pulmonary effort is normal. No respiratory distress.     Breath  sounds: Normal breath sounds.  Abdominal:     Palpations: Abdomen is soft.     Tenderness: There is no abdominal tenderness.  Musculoskeletal:        General: No swelling.     Cervical back: Neck supple.  Skin:    General: Skin is warm and dry.  Capillary Refill: Capillary refill takes less than 2 seconds.     Comments: Intact,no insect bite,redness, or wound noted.  Neurological:     General: No focal deficit present.     Mental Status: She is alert and oriented to person, place, and time.     GCS: GCS eye subscore is 4. GCS verbal subscore is 5. GCS motor subscore is 6.     Cranial Nerves: No cranial nerve deficit.     Sensory: No sensory deficit.  Psychiatric:        Mood and Affect: Mood normal.        Behavior: Behavior is cooperative.      UC Treatments / Results  Labs (all labs ordered are listed, but only abnormal results are displayed) Labs Reviewed - No data to display  EKG   Radiology No results found.  Procedures Procedures (including critical care time)  Medications Ordered in UC Medications - No data to display  Initial Impression / Assessment and Plan / UC Course  I have reviewed the triage vital signs and the nursing notes.  Pertinent labs & imaging results that were available during my care of the patient were reviewed by me and considered in my medical decision making (see chart for details).    Discussed exam findings and plan of care with patient, strict go to ER precautions given.   Patient verbalized understanding to this provider.  Ddx: Low back pain, fibromyalgia, muscle spasm of back Final Clinical Impressions(s) / UC Diagnoses   Final diagnoses:  Acute right-sided low back pain with right-sided sciatica  History of fibromyalgia  Muscle spasm of back     Discharge Instructions      Please follow up with Orthopedics as scheduled next week at [facility] Take home meds as directed.  May use heat or ice to back for comfort  20 min 3 x daily.  May use lidocaine  patch or biofreeze for pain.  Please follow up with PCP/orthopedics, may need to referral to physical therapy for further back pain management.   Pt advised otc tylenol as label directed for pain Avoid lifting,turning,bending as this will aggravate your back GO immediately to nearest ER or call 9-1-1 for loss of bowel and bladder,loss of function, saddle numbness, or worsening issues, etc.      ED Prescriptions     Medication Sig Dispense Auth. Provider   cyclobenzaprine  (FLEXERIL ) 5 MG tablet Take 1 tablet (5 mg total) by mouth 3 (three) times daily as needed for up to 5 days for muscle spasms. 15 tablet Jaide Hillenburg, Eveleen Hinds, NP      PDMP not reviewed this encounter.   Peter Brands, NP 09/12/23 1535

## 2023-09-12 NOTE — ED Triage Notes (Signed)
 Patient presents to [facility] for right lower thigh pain radiating to buttock since Thursday. States she was sitting outdoors on her porch so concerned with insect bite. Reports fever and chills Monday. Treating with ibuprofen and tylenol.

## 2023-09-12 NOTE — Discharge Instructions (Addendum)
 Please follow up with Orthopedics as scheduled next week at [facility] Take home meds as directed.  May use heat or ice to back for comfort 20 min 3 x daily.  May use lidocaine  patch or biofreeze for pain.  Please follow up with PCP/orthopedics, may need to referral to physical therapy for further back pain management.   Pt advised otc tylenol as label directed for pain Avoid lifting,turning,bending as this will aggravate your back GO immediately to nearest ER or call 9-1-1 for loss of bowel and bladder,loss of function, saddle numbness, or worsening issues, etc.

## 2023-12-18 ENCOUNTER — Other Ambulatory Visit: Payer: Self-pay | Admitting: Orthopedic Surgery

## 2023-12-18 DIAGNOSIS — M5441 Lumbago with sciatica, right side: Secondary | ICD-10-CM

## 2023-12-19 ENCOUNTER — Encounter: Payer: Self-pay | Admitting: Orthopedic Surgery

## 2023-12-21 ENCOUNTER — Encounter: Payer: Self-pay | Admitting: Orthopedic Surgery

## 2023-12-24 ENCOUNTER — Ambulatory Visit
Admission: RE | Admit: 2023-12-24 | Discharge: 2023-12-24 | Disposition: A | Discharge status: Home / Self Care | Source: Ambulatory Visit | Attending: Orthopedic Surgery | Admitting: Orthopedic Surgery

## 2023-12-24 DIAGNOSIS — M5441 Lumbago with sciatica, right side: Secondary | ICD-10-CM

## 2024-01-23 ENCOUNTER — Ambulatory Visit (INDEPENDENT_AMBULATORY_CARE_PROVIDER_SITE_OTHER): Admitting: Podiatry

## 2024-01-23 ENCOUNTER — Encounter: Payer: Self-pay | Admitting: Podiatry

## 2024-01-23 ENCOUNTER — Ambulatory Visit (INDEPENDENT_AMBULATORY_CARE_PROVIDER_SITE_OTHER)

## 2024-01-23 VITALS — Ht 58.5 in | Wt 222.0 lb

## 2024-01-23 DIAGNOSIS — M722 Plantar fascial fibromatosis: Secondary | ICD-10-CM

## 2024-01-23 DIAGNOSIS — M7751 Other enthesopathy of right foot: Secondary | ICD-10-CM | POA: Diagnosis not present

## 2024-01-23 DIAGNOSIS — M7752 Other enthesopathy of left foot: Secondary | ICD-10-CM | POA: Diagnosis not present

## 2024-01-23 MED ORDER — MELOXICAM 15 MG PO TABS: 15.0000 mg | ORAL_TABLET | Freq: Every day | ORAL | 2 refills | Status: AC

## 2024-01-23 MED ORDER — TRIAMCINOLONE ACETONIDE 10 MG/ML IJ SUSP: 10.0000 mg | Freq: Once | INTRAMUSCULAR | Status: AC

## 2024-01-23 NOTE — Progress Notes (Signed)
 Subjective:   Patient ID: Shelby Simmons, female   DOB: 48 y.o.   MRN: [id number]   HPI Patient presents stating she is getting quite a bit of discomfort in the right over left ankle and also has forefoot pain right with inflammation of the tendon complex.  Does not remember specific injury and does not smoke likes to be active   Review of Systems  All other systems reviewed and are negative.       Objective:  Physical Exam Vitals and nursing note reviewed.  Constitutional:      Appearance: She is well-developed.  Pulmonary:     Effort: Pulmonary effort is normal.  Musculoskeletal:        General: Normal range of motion.  Skin:    General: Skin is warm.  Neurological:     Mental Status: She is alert.     Neurovascular status intact muscle strength found to be adequate range of motion adequate with patient noted to have inflammation pain mostly in the sinus tarsi right over left with fluid buildup with some involvement also of the arch structure right over left with good digital perfusion well oriented     Assessment:  Acute capsulitis of the ankle joint right over left with also fasciitis symptoms secondary to gait changing     Plan:  H&P reviewed condition sterile prep injected the sinus tarsi bilateral 3 mg Kenalog  5 mg Xylocaine  discussed with person the possibility for surgery in this condition but hopefully she does continue to respond to conservative  X-rays indicate that there is no signs of arthritis there is moderate collapse medial longitudinal arch bilateral and at this time I also dispensed a ankle brace right properly fitted to the lower leg and foot in order to support the arch that moderately collapsed

## 2024-02-20 ENCOUNTER — Encounter: Payer: Self-pay | Admitting: Podiatry

## 2024-02-20 ENCOUNTER — Ambulatory Visit: Admitting: Podiatry

## 2024-02-20 DIAGNOSIS — M7751 Other enthesopathy of right foot: Secondary | ICD-10-CM | POA: Diagnosis not present

## 2024-02-20 DIAGNOSIS — N809 Endometriosis, unspecified: Secondary | ICD-10-CM | POA: Insufficient documentation

## 2024-02-20 DIAGNOSIS — J309 Allergic rhinitis, unspecified: Secondary | ICD-10-CM | POA: Insufficient documentation

## 2024-02-20 DIAGNOSIS — R059 Cough, unspecified: Secondary | ICD-10-CM | POA: Insufficient documentation

## 2024-02-20 DIAGNOSIS — N8000 Endometriosis of the uterus, unspecified: Secondary | ICD-10-CM | POA: Insufficient documentation

## 2024-02-20 DIAGNOSIS — R197 Diarrhea, unspecified: Secondary | ICD-10-CM | POA: Insufficient documentation

## 2024-02-20 MED ORDER — TRIAMCINOLONE ACETONIDE 10 MG/ML IJ SUSP
10.0000 mg | Freq: Once | INTRAMUSCULAR | Status: AC
  Administered 2024-02-20: 10 mg via INTRA_ARTICULAR

## 2024-02-20 NOTE — Progress Notes (Signed)
 Subjective:   Patient ID: Shelby Simmons, female   DOB: 48 y.o.   MRN: [id number]   HPI Patient states my ankle seems better but I am getting a lot of pain on my top of foot that seems to have occurred more since the ankle was treated   ROS      Objective:  Physical Exam  Neurovascular status intact with discomfort on the dorsum of the right foot around the midtarsal joint with significant improvement in the sinus tarsi     Assessment:  Sinus tarsitis with dorsal tendinitis right     Plan:  H&P reviewed today were focusing on the dorsal foot pain and I did sterile prep explained injection and risk and injected the dorsal midtarsal and tendon complex 3 mg dexamethasone Kenalog  5 mg Xylocaine  with sterile dressing applied.  If symptoms were to recur or persist I want to see her back again

## 2024-05-17 ENCOUNTER — Encounter: Payer: Self-pay | Admitting: Emergency Medicine

## 2024-05-17 ENCOUNTER — Ambulatory Visit: Admission: EM | Admit: 2024-05-17 | Discharge: 2024-05-17 | Disposition: A | Discharge status: Home / Self Care

## 2024-05-17 DIAGNOSIS — M130 Polyarthritis, unspecified: Secondary | ICD-10-CM

## 2024-05-17 MED ORDER — DEXAMETHASONE SOD PHOSPHATE PF 10 MG/ML IJ SOLN
10.0000 mg | Freq: Once | INTRAMUSCULAR | Status: AC
  Administered 2024-05-17: 10 mg via INTRAMUSCULAR

## 2024-05-17 NOTE — ED Triage Notes (Signed)
 Pt c/o bilateral shoulder pain, right leg pain. Started about a week ago. She states she has shoulder pain and it radiates down to her toes.

## 2024-05-17 NOTE — Discharge Instructions (Addendum)
 We have given you an injection of steroids to help decrease inflammation Dr. Renny generally to see if this helps with your pain.  Continue to take your meloxicam  as previously prescribed and continue to take the muscle laxer she been previously prescribed.  If your pain does not improve, or returns after the medication wears off for 3 days, I would recommend following up with orthopedics at [facility] clinic to discuss spinal injections.  I would also ask for a referral to a pain specialist to help better manage her chronic pain.

## 2024-05-17 NOTE — ED Provider Notes (Signed)
 " [facility]    CSN: [id number] Arrival date & time: 05/17/24  1146      History   Chief Complaint Chief Complaint  Patient presents with   Shoulder Pain    bilateral   Leg Pain    right    HPI Shelby Simmons is a 48 y.o. female.   HPI  48 year old female with past medical history severe for fibromyalgia, GERD, high cholesterol, hypertension, sleep apnea, and chronic pain presents for evaluation of pain in both of her shoulders and arms and pain in her right leg that started approximately a week ago.  She has been taking meloxicam , tizanidine, Robaxin, and gabapentin without any improvement of symptoms.  Past Medical History:  Diagnosis Date   Fibromyalgia    GERD (gastroesophageal reflux disease)    Hypercholesterolemia    Hypertension    Sleep apnea    Vertigo     Patient Active Problem List   Diagnosis Date Noted   Allergic rhinitis 02/20/2024   Cough 02/20/2024   Diarrhea 02/20/2024   Endometriosis 02/20/2024   Endometriosis of uterus 02/20/2024   Acute right-sided low back pain with right-sided sciatica 09/12/2023   History of fibromyalgia 09/12/2023   Muscle spasm of back 09/12/2023   Pneumonia of right upper lobe due to infectious organism 04/25/2022   Asthma 04/24/2022   Depression 04/24/2022   Facial paresthesia 04/24/2022   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 11/15/2021   Gastroesophageal reflux disease 11/14/2021   Left-sided weakness 11/14/2021   Carpal tunnel syndrome, right 04/05/2020   Hx of esophagitis 03/24/2020   Anemia 02/21/2020   Chronic pain 02/09/2020   Essential hypertension 02/09/2020   Primary fibromyalgia syndrome 02/09/2020   Obstructive sleep apnea of adult 02/12/2017    Past Surgical History:  Procedure Laterality Date   ABDOMINAL SURGERY     COLONOSCOPY WITH PROPOFOL  N/A 08/14/2023   Procedure: COLONOSCOPY WITH PROPOFOL ;  Surgeon: Toledo, Ladell POUR, MD;  Location: [facility];   Service: Gastroenterology;  Laterality: N/A;   ESOPHAGOGASTRODUODENOSCOPY (EGD) WITH PROPOFOL  N/A 08/14/2023   Procedure: ESOPHAGOGASTRODUODENOSCOPY (EGD) WITH PROPOFOL ;  Surgeon: Toledo, Ladell POUR, MD;  Location: [facility];  Service: Gastroenterology;  Laterality: N/A;   HERNIA REPAIR     MALONEY DILATION  08/14/2023   Procedure: DILATION, ESOPHAGUS, USING MALONEY DILATOR;  Surgeon: Toledo, Ladell POUR, MD;  Location: [facility];  Service: Gastroenterology;;    OB History   No obstetric history on file.      Home Medications    Prior to Admission medications  Medication Sig Start Date End Date Taking? Authorizing Provider  meloxicam  (MOBIC ) 15 MG tablet Take 1 tablet (15 mg total) by mouth daily. 01/23/24  Yes Regal, Pasco RAMAN, DPM  tiZANidine (ZANAFLEX) 4 MG tablet Take 4 mg by mouth every 6 (six) hours as needed for muscle spasms.   Yes [provider]  albuterol (VENTOLIN HFA) 108 (90 Base) MCG/ACT inhaler Inhale 2 puffs into the lungs every 6 (six) hours as needed for wheezing or shortness of breath.    [provider]  cetirizine (ZYRTEC) 10 MG tablet Take 10 mg by mouth daily.    [provider]  DULoxetine (CYMBALTA) 60 MG capsule Take 60 mg by mouth daily.    [provider]  esomeprazole (NEXIUM) 40 MG capsule Take 40 mg by mouth 2 (two) times daily before a meal.    [provider]  fluticasone (FLONASE) 50 MCG/ACT nasal spray Place into both nostrils  daily.    [provider]  gabapentin (NEURONTIN) 300 MG capsule Take 300 mg by mouth 3 (three) times daily.    [provider]  guaiFENesin-codeine (CHERATUSSIN AC) 100-10 MG/5ML syrup Take 10 mLs by mouth 3 (three) times daily as needed for cough. 05/23/22   Arvis Jolan NOVAK, PA-C  hydrochlorothiazide (HYDRODIURIL) 25 MG tablet Take 25 mg by mouth daily. 01/29/24   [provider]  linaclotide (LINZESS) 72 MCG capsule Take 72 mcg by mouth daily before  breakfast.    [provider]  lisinopril (ZESTRIL) 40 MG tablet Take 40 mg by mouth daily. 01/29/24   [provider]  lisinopril-hydrochlorothiazide (ZESTORETIC) 20-12.5 MG tablet Take 1 tablet by mouth daily.    [provider]  methocarbamol (ROBAXIN) 500 MG tablet Take 500 mg by mouth 4 (four) times daily.    [provider]  montelukast (SINGULAIR) 10 MG tablet Take 10 mg by mouth daily. 01/29/24   [provider]  ondansetron  (ZOFRAN ) 8 MG tablet Take 1 tablet (8 mg total) by mouth every 8 (eight) hours as needed for nausea or vomiting. 04/05/22   Brimage, Vondra, DO  traZODone (DESYREL) 50 MG tablet Take 50 mg by mouth at bedtime.    [provider]  WEGOVY 1 MG/0.5ML SOAJ SQ injection  01/29/24   [provider]    Family History History reviewed. No pertinent family history.  Social History Social History[1]   Allergies   Amoxicillin, Shellfish allergy, and Latex   Review of Systems Review of Systems  Musculoskeletal:  Positive for arthralgias. Negative for gait problem.  Neurological:  Positive for numbness.     Physical Exam Triage Vital Signs ED Triage Vitals  Encounter Vitals Group     BP      Girls Systolic BP Percentile      Girls Diastolic BP Percentile      Boys Systolic BP Percentile      Boys Diastolic BP Percentile      Pulse      Resp      Temp      Temp src      SpO2      Weight      Height      Head Circumference      Peak Flow      Pain Score      Pain Loc      Pain Education      Exclude from Growth Chart    No data found.  Updated Vital Signs BP 131/84 (BP Location: Right Arm)   Pulse 68   Temp 98.3 F (36.8 C) (Oral)   Resp 18   Ht 4' 10 (1.473 m)   Wt 222 lb 0.1 oz (100.7 kg)   LMP 05/12/2024   SpO2 96%   BMI 46.40 kg/m   Visual Acuity Right Eye Distance:   Left Eye Distance:   Bilateral Distance:    Right Eye Near:   Left Eye Near:    Bilateral Near:      Physical Exam Vitals and nursing note reviewed.  Constitutional:      Appearance: Normal appearance. She is not ill-appearing.  HENT:     Head: Normocephalic and atraumatic.  Musculoskeletal:        General: Tenderness present. No signs of injury.  Skin:    General: Skin is warm and dry.     Capillary Refill: Capillary refill takes less than 2 seconds.     Findings:  No erythema or rash.  Neurological:     General: No focal deficit present.     Mental Status: She is alert and oriented to person, place, and time.      UC Treatments / Results  Labs (all labs ordered are listed, but only abnormal results are displayed) Labs Reviewed - No data to display  EKG   Radiology No results found.  Procedures Procedures (including critical care time)  Medications Ordered in UC Medications  dexamethasone  (DECADRON ) injection 10 mg (has no administration in time range)    Initial Impression / Assessment and Plan / UC Course  I have reviewed the triage vital signs and the nursing notes.  Pertinent labs & imaging results that were available during my care of the patient were reviewed by me and considered in my medical decision making (see chart for details).   Patient is a nontoxic-appearing 48 year old female with a history of chronic pain presenting for evaluation of pain in both shoulders and pain in her right leg that has been going on for the past week.  She describes the pain is going on the outside of her right leg across the top of her foot to her big toe that is similar to her sciatic pattern.  The pain in both of her shoulders goes to the level of the elbow and the left arm and down to the hand on the right arm.  No numbness or tingling in the fingers.  Bilateral grips and upper extremity strength are 5/5 in bilateral lower extremity strength are 5/5.  The patient is followed by orthopedic surgery at [facility] and also podiatry at [facility] for her chronic pain  issues.  She has been experiencing right-sided low back pain for several months and had an MRI in August which showed multilevel degenerative disc disease with disc height loss at L5-S1 and anterolisthesis and diffuse disc bulging.  Patient also had a CT scan of her cervical spine in July 2020 which showed degenerative changes within the cervical spine.  I suspect that the patient's pain is coming from her ongoing spinal issues.  She has been told that she will need an epidural injection as well as spinal injections to help with her pain.  She is not currently being followed by a pain clinic.  Advised her that we can do a trial of IM Decadron  here to see if it improves her pain.  If her pain does not improve she will need to follow-up with orthopedics to discuss ongoing pain management and possible spinal injections.  She should continue the prescription medicine she has at home.   Final Clinical Impressions(s) / UC Diagnoses   Final diagnoses:  Polyarthropathy     Discharge Instructions      We have given you an injection of steroids to help decrease inflammation Dr. Renny generally to see if this helps with your pain.  Continue to take your meloxicam  as previously prescribed and continue to take the muscle laxer she been previously prescribed.  If your pain does not improve, or returns after the medication wears off for 3 days, I would recommend following up with orthopedics at [facility] clinic to discuss spinal injections.  I would also ask for a referral to a pain specialist to help better manage her chronic pain.     ED Prescriptions   None    PDMP not reviewed this encounter.    [1]  Social History Tobacco Use   Smoking status: Never  Smokeless tobacco: Never  Vaping Use   Vaping status: Never Used  Substance Use Topics   Alcohol use: Yes   Drug use: Never     Bernardino Ditch, NP 05/17/24 1353  "
